# Patient Record
Sex: Female | Born: 1945 | Hispanic: No | Smoking: Never smoker
Health system: Southern US, Community
[De-identification: ages and names within clinical notes are randomized; demographics above are authoritative.]

## PROBLEM LIST (undated history)

## (undated) DIAGNOSIS — I471 Supraventricular tachycardia, unspecified: Secondary | ICD-10-CM

## (undated) DIAGNOSIS — F32A Depression, unspecified: Secondary | ICD-10-CM

## (undated) DIAGNOSIS — F329 Major depressive disorder, single episode, unspecified: Secondary | ICD-10-CM

## (undated) DIAGNOSIS — M1990 Unspecified osteoarthritis, unspecified site: Secondary | ICD-10-CM

## (undated) DIAGNOSIS — E782 Mixed hyperlipidemia: Secondary | ICD-10-CM

## (undated) DIAGNOSIS — K219 Gastro-esophageal reflux disease without esophagitis: Secondary | ICD-10-CM

## (undated) HISTORY — DX: Supraventricular tachycardia: I47.1

## (undated) HISTORY — DX: Major depressive disorder, single episode, unspecified: F32.9

## (undated) HISTORY — DX: Mixed hyperlipidemia: E78.2

## (undated) HISTORY — DX: Supraventricular tachycardia, unspecified: I47.10

## (undated) HISTORY — DX: Depression, unspecified: F32.A

## (undated) HISTORY — DX: Unspecified osteoarthritis, unspecified site: M19.90

## (undated) HISTORY — PX: ABDOMINAL HYSTERECTOMY: SUR658

## (undated) HISTORY — DX: Gastro-esophageal reflux disease without esophagitis: K21.9

## (undated) HISTORY — PX: ANKLE SURGERY: SHX546

---

## 2001-07-06 ENCOUNTER — Other Ambulatory Visit: Admission: RE | Admit: 2001-07-06 | Discharge: 2001-07-06 | Payer: Self-pay | Admitting: Unknown Physician Specialty

## 2010-11-11 DIAGNOSIS — I471 Supraventricular tachycardia: Secondary | ICD-10-CM

## 2010-11-11 DIAGNOSIS — R079 Chest pain, unspecified: Secondary | ICD-10-CM

## 2010-11-26 ENCOUNTER — Encounter: Payer: Self-pay | Admitting: *Deleted

## 2010-11-28 ENCOUNTER — Encounter: Payer: Self-pay | Admitting: Cardiovascular Disease

## 2010-11-28 ENCOUNTER — Ambulatory Visit (INDEPENDENT_AMBULATORY_CARE_PROVIDER_SITE_OTHER): Payer: PRIVATE HEALTH INSURANCE | Admitting: Cardiovascular Disease

## 2010-11-28 VITALS — BP 126/73 | HR 73 | Resp 16 | Ht 62.0 in | Wt 143.0 lb

## 2010-11-28 DIAGNOSIS — I471 Supraventricular tachycardia: Secondary | ICD-10-CM

## 2010-11-28 DIAGNOSIS — I498 Other specified cardiac arrhythmias: Secondary | ICD-10-CM

## 2010-11-28 MED ORDER — DILTIAZEM HCL ER COATED BEADS 180 MG PO CP24: 180.0000 mg | ORAL_CAPSULE | Freq: Every day | ORAL | Status: DC

## 2010-11-28 NOTE — Assessment & Plan Note (Signed)
The patient was recently diagnosed with a supraventricular tachycardia and that was her first and only episode. Her echocardiogram and stress test were overall unremarkable. She has been taking Toprol daily but it seems that the had asthma might be slightly worse than before with her increased symptoms of cough. Thus, I will stop Toprol today and start her instead on diltiazem extended release 180 mg once daily. If the patient developed a second episode of supraventricular tachycardia, I favor proceeding with an ablation procedure over starting an antiarrhythmic medication. Patient will followup in 3 months from now or earlier if needed.

## 2010-11-28 NOTE — Patient Instructions (Signed)
Your physician wants you to follow-up in: 3 months. You will receive a reminder letter in the mail one-two months in advance. If you don't receive a letter, please call our office to schedule the follow-up appointment. Stop Toprol (metoprolol) Start Diltiazem ER 180 mg daily.

## 2010-11-28 NOTE — Progress Notes (Signed)
HPI  This is a 65 year old female who is here today for a followup visit. The patient was in her usual health up until recently last month when she woke up in the morning and had significant palpitations associated with dizziness and presyncope. EMS was called. Upon arrival, she was found to be in a narrow complex tachycardia with a heart rate of 210 beats per minute with significant hypotension and systolic blood pressure in the 70s. The patient was cardioverted emergently to normal sinus rhythm by EMS. She had chest pain that persisted for a few hours after the event. She was hospitalized at [facility]. Cardiac enzymes were slightly elevated. She had no recurrent arrhythmia. She had an echocardiogram done which was unremarkable. She also had a pharmacologic nuclear stress test which also was within normal limits. The patient other medical history includes asthma. She had no previous arrhythmia at all. This was her first episode of supraventricular tachycardia. Her thyroid function was normal. She does not consume excessive caffeine. The event was not triggered by anxiety or any other emotional event. The patient was discharged home on Toprol 50 mg once daily. Since then, the patient has not had any recurrent palpitations. She is having more cough than usual. Her asthma has been under very well controlled for many years.  Allergies  Allergen Reactions  . Sulfa Antibiotics      Current Outpatient Prescriptions on File Prior to Visit  Medication Sig Dispense Refill  . aspirin 81 MG tablet Take 81 mg by mouth daily.        . DULoxetine (CYMBALTA) 60 MG capsule Take 60 mg by mouth daily.        Marland Kitchen estradiol (ESTRACE) 1 MG tablet Take 1 mg by mouth daily.       . montelukast (SINGULAIR) 10 MG tablet Take 10 mg by mouth at bedtime.        . pantoprazole (PROTONIX) 40 MG tablet Take 40 mg by mouth daily.        . sertraline (ZOLOFT) 100 MG tablet Take 100 mg by mouth daily.           Past Medical  History  Diagnosis Date  . Asthma   . GERD (gastroesophageal reflux disease)   . Osteoarthritis   . Depression   . PSVT (paroxysmal supraventricular tachycardia)      Past Surgical History  Procedure Date  . Abdominal hysterectomy   . Left ankle surgery     ORIF     Family History  Problem Relation Age of Onset  . Other      negative for premature coronary artery disease or arrhythmia  . Ovarian cancer Mother      History   Social History  . Marital Status: Married    Spouse Name: N/A    Number of Children: N/A  . Years of Education: N/A   Occupational History  .      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 Smoking status: Never Smoker   . Smokeless tobacco: Never Used  . Alcohol Use: No  . Drug Use: No  . Sexually Active: Not on file   Other Topics Concern  . Not on file   Social History Narrative  . No narrative on file       PHYSICAL EXAM   BP 126/73  Pulse 73  Resp 16  Ht 5\' 2"  (1.575 m)  Wt 143 lb (64.864 kg)  BMI 26.15 kg/m2  Constitutional: She is oriented to person, place, and time. She appears well-developed and well-nourished. No distress.  HENT: No nasal discharge.  Head: Normocephalic and atraumatic.  Eyes: Pupils are equal, round, and reactive to light. Right eye exhibits no discharge. Left eye exhibits no discharge.  Neck: Normal range of motion. Neck supple. No JVD present. No thyromegaly present.  Cardiovascular: Normal rate, regular rhythm, normal heart sounds and intact distal pulses. Exam reveals no gallop and no friction rub.  No murmur heard.  Pulmonary/Chest: Effort normal and breath sounds normal. No stridor. No respiratory distress. She has no wheezes. She has no rales. She exhibits no tenderness.  Abdominal: Soft. Bowel sounds are normal. She exhibits no distension. There is no tenderness. There is no rebound and no guarding.  Musculoskeletal: Normal range of motion. She exhibits no  edema and no tenderness.  Neurological: She is alert and oriented to person, place, and time. Coordination normal.  Skin: Skin is warm and dry. No rash noted. She is not diaphoretic. No erythema. No pallor.  Psychiatric: She has a normal mood and affect. Her behavior is normal. Judgment and thought content normal.    EKG: Normal sinus rhythm with no significant ST or T wave changes. PR and QT intervals are normal. There is poor R wave progression in the anterior leads.   ASSESSMENT AND PLAN

## 2011-05-08 ENCOUNTER — Ambulatory Visit (INDEPENDENT_AMBULATORY_CARE_PROVIDER_SITE_OTHER): Payer: PRIVATE HEALTH INSURANCE | Admitting: Cardiovascular Disease

## 2011-05-08 ENCOUNTER — Encounter: Payer: Self-pay | Admitting: Cardiovascular Disease

## 2011-05-08 VITALS — BP 137/85 | HR 87 | Ht 62.0 in | Wt 146.0 lb

## 2011-05-08 DIAGNOSIS — I471 Supraventricular tachycardia, unspecified: Secondary | ICD-10-CM

## 2011-05-08 MED ORDER — DILTIAZEM HCL ER COATED BEADS 240 MG PO CP24: 240.0000 mg | ORAL_CAPSULE | Freq: Every day | ORAL | Status: DC

## 2011-05-08 NOTE — Assessment & Plan Note (Signed)
Patient seems to be doing reasonably well. She had no prolonged episodes of tachycardia. She had few episodes that lasted for less than a minute. I recommend increasing the dose of diltiazem extended release 240 mg once daily. If she develops any prolonged episodes in the future, I recommend proceeding with ablation. She is to followup in 6 months or earlier if needed.

## 2011-05-08 NOTE — Patient Instructions (Signed)
Your physician recommends that you schedule a follow-up appointment in:6 months. You will receive a reminder letter in the mail about 1-2 months in advance reminding you to call and schedule your appointment. If you don't receive this letter, please contact our office.  Your physician has recommended you make the following change in your medication: INCREASED DILTIAZEM ER TO 240 MG DAILY. Your new prescription has been sent to your pharmacy.

## 2011-05-08 NOTE — Progress Notes (Signed)
HPI  This is a 66 year old female who is here today for followup visit. She has a history of paroxysmal supraventricular tachycardia. She had only one episode but that was actually severe associated with a heart rate of 210 beats per minute with hypotension. Required emergent cardioversion by EMS. Echocardiogram and nuclear stress test were both unremarkable. She was initially treated with metoprolol which was switched during her last visit to diltiazem due to worsening asthma. Since that time, she has not had any prolonged episodes. She had few episodes that lasted less than a minute and she was able to terminate them with her vagal maneuvers. Otherwise he denies any chest pain or dyspnea.  Allergies  Allergen Reactions  . Sulfa Antibiotics      Current Outpatient Prescriptions on File Prior to Visit  Medication Sig Dispense Refill  . ALPHA LIPOIC ACID PO Take by mouth daily.        Marland Kitchen aspirin 81 MG tablet Take 81 mg by mouth daily.        Jeanie Cooks SERRATA PO Take by mouth.        . Bromelains (BROMELAIN PO) Take by mouth.        . Calcium Carbonate-Vitamin D (CALCIUM 600 + D PO) Take by mouth daily.        . DULoxetine (CYMBALTA) 60 MG capsule Take 60 mg by mouth daily.        Marland Kitchen estradiol (ESTRACE) 1 MG tablet Take 1 mg by mouth daily.       . Lactobacillus (ACIDOPHILUS PO) Take by mouth.        Marland Kitchen MAGNESIUM PO Take by mouth.        . montelukast (SINGULAIR) 10 MG tablet Take 10 mg by mouth at bedtime.        . Multiple Vitamin (MULTIVITAMIN) capsule Take 1 capsule by mouth daily.        . NON FORMULARY ajipure Daily        . Nutritional Supplements (ARTHRO-COMPLEX PO) Take by mouth daily.        . Nutritional Supplements (JOINT FORMULA PO) Take by mouth daily.        . Omega-3 Fatty Acids (FISH OIL PO) Take by mouth.        . pantoprazole (PROTONIX) 40 MG tablet Take 40 mg by mouth daily.        Marland Kitchen QUERCETIN PO Take by mouth.        . sertraline (ZOLOFT) 100 MG tablet Take 100 mg by  mouth daily.        . Vitamins C E (VITAMIN C & E COMPLEX PO) Take by mouth.           Past Medical History  Diagnosis Date  . Asthma   . GERD (gastroesophageal reflux disease)   . Osteoarthritis   . Depression   . PSVT (paroxysmal supraventricular tachycardia)      Past Surgical History  Procedure Date  . Abdominal hysterectomy   . Left ankle surgery     ORIF     Family History  Problem Relation Age of Onset  . Other      negative for premature coronary artery disease or arrhythmia  . Ovarian cancer Mother      History   Social History  . Marital Status: Married    Spouse Name: N/A    Number of Children: N/A  . Years of Education: N/A   Occupational History  .      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 Smoking status: Never Smoker   . Smokeless tobacco: Never Used  . Alcohol Use: No  . Drug Use: No  . Sexually Active: Not on file   Other Topics Concern  . Not on file   Social History Narrative  . No narrative on file     PHYSICAL EXAM   BP 137/85  Pulse 87  Ht 5\' 2"  (1.575 m)  Wt 146 lb (66.225 kg)  BMI 26.70 kg/m2 Constitutional: She is oriented to person, place, and time. She appears well-developed and well-nourished. No distress.  HENT: No nasal discharge.  Head: Normocephalic and atraumatic.  Eyes: Pupils are equal and round. Right eye exhibits no discharge. Left eye exhibits no discharge.  Neck: Normal range of motion. Neck supple. No JVD present. No thyromegaly present.  Cardiovascular: Normal rate, regular rhythm, normal heart sounds. Exam reveals no gallop and no friction rub. No murmur heard.  Pulmonary/Chest: Effort normal and breath sounds normal. No stridor. No respiratory distress. She has no wheezes. She has no rales. She exhibits no tenderness.  Abdominal: Soft. Bowel sounds are normal. She exhibits no distension. There is no tenderness. There is no rebound and no guarding.  Musculoskeletal:  Normal range of motion. She exhibits no edema and no tenderness.  Neurological: She is alert and oriented to person, place, and time. Coordination normal.  Skin: Skin is warm and dry. No rash noted. She is not diaphoretic. No erythema. No pallor.  Psychiatric: She has a normal mood and affect. Her behavior is normal. Judgment and thought content normal.      ASSESSMENT AND PLAN

## 2011-11-13 ENCOUNTER — Ambulatory Visit: Payer: PRIVATE HEALTH INSURANCE | Admitting: Cardiology

## 2011-12-10 ENCOUNTER — Other Ambulatory Visit: Payer: Self-pay | Admitting: Cardiovascular Disease

## 2011-12-30 ENCOUNTER — Ambulatory Visit: Payer: PRIVATE HEALTH INSURANCE | Admitting: Cardiology

## 2011-12-30 ENCOUNTER — Encounter: Payer: Self-pay | Admitting: Cardiology

## 2011-12-30 DIAGNOSIS — I471 Supraventricular tachycardia: Secondary | ICD-10-CM | POA: Insufficient documentation

## 2011-12-30 DIAGNOSIS — R9430 Abnormal result of cardiovascular function study, unspecified: Secondary | ICD-10-CM | POA: Insufficient documentation

## 2011-12-30 DIAGNOSIS — M1990 Unspecified osteoarthritis, unspecified site: Secondary | ICD-10-CM | POA: Insufficient documentation

## 2011-12-30 DIAGNOSIS — F329 Major depressive disorder, single episode, unspecified: Secondary | ICD-10-CM | POA: Insufficient documentation

## 2011-12-30 DIAGNOSIS — K219 Gastro-esophageal reflux disease without esophagitis: Secondary | ICD-10-CM | POA: Insufficient documentation

## 2011-12-30 DIAGNOSIS — IMO0001 Reserved for inherently not codable concepts without codable children: Secondary | ICD-10-CM | POA: Insufficient documentation

## 2011-12-30 DIAGNOSIS — J45909 Unspecified asthma, uncomplicated: Secondary | ICD-10-CM | POA: Insufficient documentation

## 2012-01-01 ENCOUNTER — Ambulatory Visit (INDEPENDENT_AMBULATORY_CARE_PROVIDER_SITE_OTHER): Payer: PRIVATE HEALTH INSURANCE | Admitting: Cardiology

## 2012-01-01 ENCOUNTER — Encounter: Payer: Self-pay | Admitting: Cardiology

## 2012-01-01 VITALS — BP 129/78 | HR 85 | Ht 62.0 in | Wt 147.0 lb

## 2012-01-01 DIAGNOSIS — K219 Gastro-esophageal reflux disease without esophagitis: Secondary | ICD-10-CM

## 2012-01-01 DIAGNOSIS — I471 Supraventricular tachycardia, unspecified: Secondary | ICD-10-CM

## 2012-01-01 DIAGNOSIS — J45909 Unspecified asthma, uncomplicated: Secondary | ICD-10-CM

## 2012-01-01 DIAGNOSIS — F329 Major depressive disorder, single episode, unspecified: Secondary | ICD-10-CM

## 2012-01-01 DIAGNOSIS — F3289 Other specified depressive episodes: Secondary | ICD-10-CM

## 2012-01-01 NOTE — Assessment & Plan Note (Signed)
The patient is doing well. She is on medication. She does not appear to be suffering from any significant depression today.

## 2012-01-01 NOTE — Progress Notes (Signed)
Patient ID: Sheena Clay, female   DOB: 03-15-1946, 66 y.o.   MRN: [id number]   HPI   The patient is seen today to followup supraventricular tachycardia. She saw Dr. Kirke Corin in our office in February, 2013. He is based primarily in our [place] office. I explained this to her and I will provide her care over time. She understands. The patient had an episode of supraventricular tachycardia with a rate of 210. She had hypotension. She did require emergent cardioversion by EMS. Workup included a normal echo and a normal stress test. She is on diltiazem. She is not on a beta blocker because of worsening asthma. She has been stable. We have raised the question of proceeding with an ablation. She does not want to have this unless she has further problems. She has some very short palpitations at times. She is stable.  As part of today's evaluation I have reviewed the prior records in the chart and adjusted the problem list.  Allergies  Allergen Reactions  . Sulfa Antibiotics     Current Outpatient Prescriptions  Medication Sig Dispense Refill  . ALPHA LIPOIC ACID PO Take by mouth daily.        Marland Kitchen aspirin 81 MG tablet Take 81 mg by mouth daily.        Jeanie Cooks SERRATA PO Take by mouth.        . Bromelains (BROMELAIN PO) Take by mouth.        . Calcium Carbonate-Vitamin D (CALCIUM 600 + D PO) Take by mouth daily.        Marland Kitchen CARDIZEM CD 240 MG 24 hr capsule TAKE 1 CAPSULE BY MOUTH ONCE DAILY.  90 capsule  3  . DULoxetine (CYMBALTA) 60 MG capsule Take 60 mg by mouth daily.        Marland Kitchen estradiol (ESTRACE) 1 MG tablet Take 1 mg by mouth daily.       . Lactobacillus (ACIDOPHILUS PO) Take by mouth.        Marland Kitchen MAGNESIUM PO Take by mouth.        . montelukast (SINGULAIR) 10 MG tablet Take 10 mg by mouth at bedtime.        . Multiple Vitamin (MULTIVITAMIN) capsule Take 1 capsule by mouth daily.        . NON FORMULARY ajipure Daily        . Nutritional Supplements (ARTHRO-COMPLEX PO) Take by mouth daily.          . Nutritional Supplements (JOINT FORMULA PO) Take by mouth daily.        . Omega-3 Fatty Acids (FISH OIL PO) Take by mouth.        . pantoprazole (PROTONIX) 40 MG tablet Take 40 mg by mouth daily.        Marland Kitchen QUERCETIN PO Take by mouth.        . sertraline (ZOLOFT) 100 MG tablet Take 100 mg by mouth daily.        . Vitamins C E (VITAMIN C & E COMPLEX PO) Take by mouth.          History   Social History  . Marital Status: Married    Spouse Name: N/A    Number of Children: N/A  . Years of Education: N/A   Occupational History  .      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 Smoking status: Never Smoker   . Smokeless tobacco: Never Used  . Alcohol Use:  No  . Drug Use: No  . Sexually Active: Not on file   Other Topics Concern  . Not on file   Social History Narrative  . No narrative on file    Family History  Problem Relation Age of Onset  . Other      negative for premature coronary artery disease or arrhythmia  . Ovarian cancer Mother     Past Medical History  Diagnosis Date  . Asthma   . GERD (gastroesophageal reflux disease)   . Osteoarthritis   . Depression   . PSVT (paroxysmal supraventricular tachycardia)     210 beats per minute with hypotension, required emergent cardioversion by EMS,  ablation has been recommended by the cardiology team  . Ejection fraction     EF 65%, echo, August, 2012  . Normal cardiac stress test     Normal nuclear stress test, August, 2012    Past Surgical History  Procedure Date  . Abdominal hysterectomy   . Left ankle surgery     ORIF    Patient Active Problem List  Diagnosis  . GERD (gastroesophageal reflux disease)  . Osteoarthritis  . Depression  . PSVT (paroxysmal supraventricular tachycardia)  . Asthma  . Ejection fraction  . Normal cardiac stress test    ROS   Patient denies fever, chills, headache, sweats, rash, change in vision, change in hearing, chest pain, cough, nausea  vomiting, urinary symptoms. All other systems are reviewed and are negative.  PHYSICAL EXAM  Patient is oriented to person time and place. Affect is normal. There is no jugular venous distention. Lungs are clear. Respiratory effort is nonlabored. Cardiac exam reveals S1 and S2. There no clicks or significant murmurs. The abdomen is soft. There is no peripheral edema. There no musculoskeletal deformities. There are no skin rashes.  Filed Vitals:   01/01/12 1324  BP: 129/78  Pulse: 85  Height: 5\' 2"  (1.575 m)  Weight: 147 lb (66.679 kg)  SpO2: 98%   EKG is done today and reviewed by me. The rhythm is sinus. There are no diagnostic changes. There is no change from the past.  ASSESSMENT & PLAN

## 2012-01-01 NOTE — Assessment & Plan Note (Signed)
She is not having any significant GERD symptoms at this time. No further workup.

## 2012-01-01 NOTE — Patient Instructions (Addendum)

## 2012-01-01 NOTE — Assessment & Plan Note (Signed)
The patient is treated for asthma. She has been stable. When beta blockers were used it caused some increased symptoms. She is stable now.

## 2012-01-01 NOTE — Assessment & Plan Note (Signed)
The patient had rapid supraventricular tachycardia. She is stable. She continues on diltiazem. I made it clear to her that she should let us know of she has any increased symptoms. Her rate was very fast with her supraventricular tachycardia. If she has recurrent problems consideration should be given to proceeding with ablation. She has been hesitant up to this point. I will see her back in one year for followup.

## 2012-12-15 ENCOUNTER — Other Ambulatory Visit: Payer: Self-pay | Admitting: Cardiovascular Disease

## 2012-12-15 ENCOUNTER — Other Ambulatory Visit: Payer: Self-pay | Admitting: *Deleted

## 2012-12-15 MED ORDER — DILTIAZEM HCL ER COATED BEADS 240 MG PO CP24: ORAL_CAPSULE | ORAL | Status: DC

## 2012-12-15 NOTE — Telephone Encounter (Signed)
Refilled Diltiazem sent to [facility]. Pharmacy.

## 2013-01-09 ENCOUNTER — Ambulatory Visit (INDEPENDENT_AMBULATORY_CARE_PROVIDER_SITE_OTHER): Payer: PRIVATE HEALTH INSURANCE | Admitting: Cardiology

## 2013-01-09 ENCOUNTER — Encounter: Payer: Self-pay | Admitting: Cardiology

## 2013-01-09 VITALS — BP 123/75 | HR 75 | Ht 62.0 in | Wt 139.0 lb

## 2013-01-09 DIAGNOSIS — I471 Supraventricular tachycardia: Secondary | ICD-10-CM

## 2013-01-09 DIAGNOSIS — K219 Gastro-esophageal reflux disease without esophagitis: Secondary | ICD-10-CM

## 2013-01-09 DIAGNOSIS — E785 Hyperlipidemia, unspecified: Secondary | ICD-10-CM

## 2013-01-09 DIAGNOSIS — Z888 Allergy status to other drugs, medicaments and biological substances status: Secondary | ICD-10-CM

## 2013-01-09 DIAGNOSIS — Z789 Other specified health status: Secondary | ICD-10-CM

## 2013-01-09 NOTE — Progress Notes (Signed)
HPI  This very pleasant lady is here for the followup of her supraventricular tachycardia. She is doing well. She has not had return of any of her significant arrhythmias. She has rapid SVT. She knows that if she has recurrence that ablation is a option. She does feel rare palpitations when she uses her medicines for migraines. She has an elevated LDL with a high HDL. She is statin intolerant.  Allergies  Allergen Reactions  . Sulfa Antibiotics   . Penicillins Itching and Rash    Current Outpatient Prescriptions  Medication Sig Dispense Refill  . ALPHA LIPOIC ACID PO Take by mouth daily.        Marland Kitchen aspirin 81 MG tablet Take 81 mg by mouth daily.        Jeanie Cooks SERRATA PO Take by mouth.        . Bromelains (BROMELAIN PO) Take by mouth.        . Calcium Carbonate-Vitamin D (CALCIUM 600 + D PO) Take by mouth daily.        Marland Kitchen diltiazem (CARDIZEM CD) 240 MG 24 hr capsule TAKE 1 CAPSULE BY MOUTH ONCE DAILY.  90 capsule  3  . DULoxetine (CYMBALTA) 60 MG capsule Take 60 mg by mouth daily.        Marland Kitchen esomeprazole (NEXIUM) 40 MG capsule Take 40 mg by mouth daily before breakfast.      . estradiol (ESTRACE) 1 MG tablet Take 0.5 mg by mouth daily.       . Lactobacillus (ACIDOPHILUS PO) Take by mouth.        Marland Kitchen MAGNESIUM PO Take by mouth.        . montelukast (SINGULAIR) 10 MG tablet Take 10 mg by mouth at bedtime.        . Multiple Vitamin (MULTIVITAMIN) capsule Take 1 capsule by mouth daily.        . NON FORMULARY ajipure Daily        . Nutritional Supplements (ARTHRO-COMPLEX PO) Take by mouth daily.        . Nutritional Supplements (JOINT FORMULA PO) Take by mouth daily.        . Omega-3 Fatty Acids (FISH OIL PO) Take by mouth.        . QUERCETIN PO Take by mouth.        . sertraline (ZOLOFT) 100 MG tablet Take 100 mg by mouth daily.        . Vitamins C E (VITAMIN C & E COMPLEX PO) Take by mouth.         No current facility-administered medications for this visit.    History   Social  History  . Marital Status: Married    Spouse Name: N/A    Number of Children: N/A  . Years of Education: N/A   Occupational History  .      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 Smoking status: Never Smoker   . Smokeless tobacco: Never Used  . Alcohol Use: No  . Drug Use: No  . Sexual Activity: Not on file   Other Topics Concern  . Not on file   Social History Narrative  . No narrative on file    Family History  Problem Relation Age of Onset  . Other      negative for premature coronary artery disease or arrhythmia  . Ovarian cancer Mother     Past Medical History  Diagnosis Date  . Asthma   .  GERD (gastroesophageal reflux disease)   . Osteoarthritis   . Depression   . PSVT (paroxysmal supraventricular tachycardia)     210 beats per minute with hypotension, required emergent cardioversion by EMS,  ablation has been recommended by the cardiology team  . Ejection fraction     EF 65%, echo, August, 2012  . Normal cardiac stress test     Normal nuclear stress test, August, 2012    Past Surgical History  Procedure Laterality Date  . Abdominal hysterectomy    . Left ankle surgery      ORIF    Patient Active Problem List   Diagnosis Date Noted  . Dyslipidemia 01/09/2013  . Statin intolerance 01/09/2013  . Asthma 12/30/2011  . GERD (gastroesophageal reflux disease)   . Osteoarthritis   . Depression   . PSVT (paroxysmal supraventricular tachycardia)   . Ejection fraction   . Normal cardiac stress test     ROS   Patient denies fever, chills, headache, sweats, rash, change in vision, change in hearing, chest pain, cough, nausea vomiting, urinary symptoms. All other systems are reviewed and are negative.  PHYSICAL EXAM   Patient is oriented to person time and place. Affect is normal. There is no jugular venous distention. Lungs are clear. Respiratory effort is nonlabored. Cardiac exam reveals S1 and S2. There no clicks or  significant murmurs. The abdomen is soft. There is no peripheral edema.  Filed Vitals:   01/09/13 0809  BP: 123/75  Pulse: 75  Height: 5\' 2"  (1.575 m)  Weight: 139 lb (63.05 kg)     ASSESSMENT & PLAN

## 2013-01-09 NOTE — Patient Instructions (Signed)

## 2013-01-09 NOTE — Assessment & Plan Note (Signed)
Unfortunately she is statin intolerant. She does not have proven coronary disease. She has a high HDL. No change in therapy.

## 2013-01-09 NOTE — Assessment & Plan Note (Signed)
She has not had any return of her rapid supraventricular tachycardia. She knows to contact us if she does.

## 2013-01-09 NOTE — Assessment & Plan Note (Signed)
I suggested that she put some blocks under the head of her bed.

## 2013-12-14 ENCOUNTER — Other Ambulatory Visit: Payer: Self-pay | Admitting: Cardiovascular Disease

## 2014-01-01 ENCOUNTER — Ambulatory Visit (INDEPENDENT_AMBULATORY_CARE_PROVIDER_SITE_OTHER): Payer: 59 | Admitting: Cardiology

## 2014-01-01 ENCOUNTER — Encounter: Payer: Self-pay | Admitting: Cardiology

## 2014-01-01 VITALS — BP 133/84 | HR 69 | Ht 62.0 in | Wt 142.5 lb

## 2014-01-01 DIAGNOSIS — R9430 Abnormal result of cardiovascular function study, unspecified: Secondary | ICD-10-CM

## 2014-01-01 DIAGNOSIS — E785 Hyperlipidemia, unspecified: Secondary | ICD-10-CM

## 2014-01-01 DIAGNOSIS — I471 Supraventricular tachycardia: Secondary | ICD-10-CM

## 2014-01-01 DIAGNOSIS — R0989 Other specified symptoms and signs involving the circulatory and respiratory systems: Secondary | ICD-10-CM

## 2014-01-01 NOTE — Progress Notes (Signed)
Patient ID: Sheena Clay, female   DOB: Mar 09, 1946, 68 y.o.   MRN: [id number]    HPI  Patient is seen today to followup a history of supraventricular tachycardia. In 2010 she had an episode of SVT with a rate of 210. She was evaluated fully. She had normal LV function and no signs of ischemia. It was felt that an ablation could definitely be undertaken. The patient decided that she wanted to be followed over time. If she has recurrent problems more aggressive EP evaluation will be in order. She has not had any symptoms.  Allergies  Allergen Reactions  . Sulfa Antibiotics   . Penicillins Itching and Rash    Current Outpatient Prescriptions  Medication Sig Dispense Refill  . aspirin 81 MG tablet Take 81 mg by mouth daily.        . Calcium Carbonate-Vitamin D (CALCIUM 600 + D PO) Take by mouth daily.        Marland Kitchen. CARTIA XT 240 MG 24 hr capsule TAKE 1 CAPSULE BY MOUTH ONCE A DAY.  30 capsule  1  . Cetirizine HCl (ZYRTEC PO) Take by mouth daily.      . DULoxetine (CYMBALTA) 60 MG capsule Take 60 mg by mouth daily.        Marland Kitchen. esomeprazole (NEXIUM) 40 MG capsule Take 40 mg by mouth daily before breakfast.      . fluticasone (FLONASE) 50 MCG/ACT nasal spray Place 1 spray into both nostrils daily.      . montelukast (SINGULAIR) 10 MG tablet Take 10 mg by mouth at bedtime.        . Multiple Vitamin (MULTIVITAMIN) capsule Take 1 capsule by mouth daily.        . Nutritional Supplements (ARTHRO-COMPLEX PO) Take by mouth daily.        . Nutritional Supplements (JOINT FORMULA PO) Take by mouth daily.        . Omega-3 Fatty Acids (FISH OIL PO) Take by mouth.        . sertraline (ZOLOFT) 100 MG tablet Take 100 mg by mouth daily.         No current facility-administered medications for this visit.    History   Social History  . Marital Status: Married    Spouse Name: N/A    Number of Children: N/A  . Years of Education: N/A   Occupational History  .      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 Smoking status: Never Smoker   . Smokeless tobacco: Never Used  . Alcohol Use: No  . Drug Use: No  . Sexual Activity: Not on file   Other Topics Concern  . Not on file   Social History Narrative  . No narrative on file    Family History  Problem Relation Age of Onset  . Other      negative for premature coronary artery disease or arrhythmia  . Ovarian cancer Mother     Past Medical History  Diagnosis Date  . Asthma   . GERD (gastroesophageal reflux disease)   . Osteoarthritis   . Depression   . PSVT (paroxysmal supraventricular tachycardia)     210 beats per minute with hypotension, required emergent cardioversion by EMS,  ablation has been recommended by the cardiology team  . Ejection fraction     EF 65%, echo, August, 2012  . Normal cardiac stress test     Normal nuclear stress test, August, 2012  Past Surgical History  Procedure Laterality Date  . Abdominal hysterectomy    . Left ankle surgery      ORIF    Patient Active Problem List   Diagnosis Date Noted  . Dyslipidemia 01/09/2013  . Statin intolerance 01/09/2013  . Asthma 12/30/2011  . GERD (gastroesophageal reflux disease)   . Osteoarthritis   . Depression   . PSVT (paroxysmal supraventricular tachycardia)   . Ejection fraction   . Normal cardiac stress test     ROS   Patient denies fever, chills, headache, sweats, rash, change in vision, change in hearing, chest pain, cough, nausea vomiting, urinary symptoms. All other systems are reviewed and are negative.  PHYSICAL EXAM  Patient is oriented to person time and place. Affect is normal. Head is atraumatic. Sclera and conjunctiva are normal. There is no jugulovenous distention. Lungs are clear. Respiratory effort is nonlabored. Cardiac exam reveals S1 and S2. The abdomen is soft. There is no peripheral edema.  Filed Vitals:   01/01/14 1410  BP: 133/84  Pulse: 69  Height: 5\' 2"  (1.575 m)  Weight: 142 lb 8 oz  (64.638 kg)  SpO2: 100%   EKG is done today and reviewed by me. There is no significant change from the past.  ASSESSMENT & PLAN

## 2014-01-01 NOTE — Assessment & Plan Note (Signed)
The patient had an episode of very significant SVT in 2012. Heart rate was 210. There was hypotension. She required emergent urgent by EMS. She's been completely stable since then. She does not need any further evaluation at this time. If she has recurrent SVT of this type, ablation will be strongly considered.

## 2014-01-01 NOTE — Assessment & Plan Note (Signed)
The patient is statin intolerant.

## 2014-01-01 NOTE — Patient Instructions (Signed)
Continue all current medications. Your physician wants you to follow up in:  1 year.  You will receive a reminder letter in the mail one-two months in advance.  If you don't receive a letter, please call our office to schedule the follow up appointment   

## 2014-01-01 NOTE — Assessment & Plan Note (Signed)
Her ejection fraction was normal by echo in August, 2012. No further workup.

## 2014-01-12 ENCOUNTER — Other Ambulatory Visit: Payer: Self-pay | Admitting: Cardiology

## 2014-09-12 ENCOUNTER — Other Ambulatory Visit: Payer: Self-pay | Admitting: Cardiology

## 2014-12-12 ENCOUNTER — Other Ambulatory Visit: Payer: Self-pay | Admitting: Cardiology

## 2015-01-01 ENCOUNTER — Encounter: Payer: Self-pay | Admitting: Cardiology

## 2015-01-01 ENCOUNTER — Ambulatory Visit (INDEPENDENT_AMBULATORY_CARE_PROVIDER_SITE_OTHER): Payer: Medicare PPO | Admitting: Cardiology

## 2015-01-01 VITALS — BP 114/73 | HR 87 | Ht 62.0 in | Wt 145.0 lb

## 2015-01-01 DIAGNOSIS — Z23 Encounter for immunization: Secondary | ICD-10-CM

## 2015-01-01 DIAGNOSIS — I471 Supraventricular tachycardia: Secondary | ICD-10-CM | POA: Diagnosis not present

## 2015-01-01 NOTE — Progress Notes (Signed)
Cardiology Office Note  Date: 01/01/2015   ID: Georgiann HahnKathy J [facility], DOB 06/06/1945, MRN [id number]  PCP: Louie BostonAPPER,DAVID B, MD  Primary Cardiologist: Nona DellSamuel Raylyn Carton, MD   Chief Complaint  Patient presents with  . PSVT    History of Present Illness: Sheena Clay is a 69 y.o. female former patient of Dr. Myrtis SerKatz, now establishing with me for regular follow-up. This is our first meeting.  I reviewed her records. She has a history of very rapid, symptomatic PSVT associated with syncope and requiring urgent cardioversion back in 2012. Structural and ischemic cardiac workup was reassuring at that time as outlined below, and she has been managed medically since then.  She tells me that within the last year she has had 2 episodes of brief palpitations lasting no more than a few minutes. She bears down and does vagal maneuvers when this occurs, has not had chest pain or syncope with any of these brief events. She is satisfied with her symptom control now, and continues on Cardizem CD.  ECG today shows normal sinus rhythm with borderline low voltage and decreased R wave progression, R prime in lead V1 and V2.   Past Medical History  Diagnosis Date  . Asthma   . GERD (gastroesophageal reflux disease)   . Osteoarthritis   . Depression   . PSVT (paroxysmal supraventricular tachycardia) (HCC)     Documented 2012 with syncope and urgent cardioversion    Current Outpatient Prescriptions  Medication Sig Dispense Refill  . aspirin 81 MG tablet Take 81 mg by mouth daily.      . Calcium Carbonate-Vitamin D (CALCIUM 600 + D PO) Take by mouth daily.      . Cetirizine HCl (ZYRTEC PO) Take by mouth daily.    Marland Kitchen. diltiazem (CARDIZEM CD) 240 MG 24 hr capsule TAKE 1 CAPSULE DAILY. 30 capsule 6  . DULoxetine (CYMBALTA) 60 MG capsule Take 60 mg by mouth daily.      Marland Kitchen. esomeprazole (NEXIUM) 40 MG capsule Take 40 mg by mouth daily before breakfast.    . fluticasone (FLONASE) 50 MCG/ACT nasal spray Place 1 spray  into both nostrils daily.    . montelukast (SINGULAIR) 10 MG tablet Take 10 mg by mouth at bedtime.      . Multiple Vitamin (MULTIVITAMIN) capsule Take 1 capsule by mouth daily.      . Nutritional Supplements (ARTHRO-COMPLEX PO) Take by mouth daily.      . Nutritional Supplements (JOINT FORMULA PO) Take by mouth daily.      . Omega-3 Fatty Acids (FISH OIL PO) Take by mouth.      . sertraline (ZOLOFT) 100 MG tablet Take 100 mg by mouth daily.       No current facility-administered medications for this visit.    Allergies:  Sulfa antibiotics and Penicillins   Social History: The patient  reports that she has never smoked. She has never used smokeless tobacco. She reports that she does not drink alcohol or use illicit drugs.   ROS:  Please see the history of present illness. Otherwise, complete review of systems is positive for none.  All other systems are reviewed and negative.   Physical Exam: VS:  BP 114/73 mmHg  Pulse 87  Ht 5\' 2"  (1.575 m)  Wt 145 lb (65.772 kg)  BMI 26.51 kg/m2  SpO2 96%, BMI Body mass index is 26.51 kg/(m^2).  Wt Readings from Last 3 Encounters:  01/01/15 145 lb (65.772 kg)  01/01/14 142 lb 8 oz (  64.638 kg)  01/09/13 139 lb (63.05 kg)     General: Patient appears comfortable at rest. HEENT: Conjunctiva and lids normal, oropharynx clear. Neck: Supple, no elevated JVP or carotid bruits, no thyromegaly. Lungs: Clear to auscultation, nonlabored breathing at rest. Cardiac: Regular rate and rhythm, no S3 or significant systolic murmur, no pericardial rub. Abdomen: Soft, nontender, bowel sounds present. Extremities: No pitting edema, distal pulses 2+.  ECG: ECG is ordered today.  Other Studies Reviewed Today:  Echocardiogram 11/11/2010 [facility]) reported LVEF greater than 65% with diastolic dysfunction, trace mitral regurgitation, trace tricuspid regurgitation, RVSP 18 mmHg.  Lexiscan Cardiolite 11/11/2010 [facility]) reported no diagnostic ST segment  abnormalities or arrhythmias, breast attenuation artifact without clear evidence of scar or ischemia, LVEF 83%.  Assessment and Plan:  History of PSVT as outlined, symptomatically well controlled at this time on Cardizem CD. Follow-up ECG reviewed. We discussed the possibility of considering radiofrequency ablation if symptoms escalate despite medical therapy, for now we will continue observation.  Current medicines were reviewed with the patient today.   Orders Placed This Encounter  Procedures  . EKG 12-Lead    Disposition: FU with me in 1 year.   Signed, Jonelle Sidle, MD, [facility] 01/01/2015 1:23 PM    [facility] at [facility] [address] [place], [place], [place] [address] Phone: [phone]; Fax: [phone]

## 2015-01-01 NOTE — Patient Instructions (Signed)

## 2015-08-09 ENCOUNTER — Other Ambulatory Visit: Payer: Self-pay | Admitting: Cardiology

## 2015-12-20 ENCOUNTER — Encounter: Payer: Self-pay | Admitting: *Deleted

## 2015-12-22 NOTE — Progress Notes (Signed)
Cardiology Office Note  Date: 12/23/2015   ID: Sheena [facility]Kathy J [facility], DOB Apr 02, 1945, MRN [id number]  PCP: Louie BostonAPPER,DAVID B, MD  Primary Cardiologist: Nona DellSamuel Ty Oshima, MD   Chief Complaint  Patient presents with  . PSVT    History of Present Illness: Sheena Clay is a 70 y.o. female last seen in October 2016. She presents for a routine follow-up visit. Over the last year she reports no significant palpitations. She continues on Cardizem CD 240 mg daily and aspirin. She remains busy, no decline in stamina, no exertional chest pain or syncope.  I reviewed her ECG today which shows sinus rhythm with left anterior fascicular block.  Past Medical History:  Diagnosis Date  . Asthma   . Depression   . GERD (gastroesophageal reflux disease)   . Osteoarthritis   . PSVT (paroxysmal supraventricular tachycardia) (HCC)    Documented 2012 with syncope and urgent cardioversion    Current Outpatient Prescriptions  Medication Sig Dispense Refill  . aspirin 81 MG tablet Take 81 mg by mouth daily.      . Calcium Carbonate-Vitamin D (CALCIUM 600 + D PO) Take by mouth daily.      . Cetirizine HCl (ZYRTEC PO) Take 10 mg by mouth daily.     Marland Kitchen. diltiazem (CARDIZEM CD) 240 MG 24 hr capsule TAKE 1 CAPSULE DAILY. 30 capsule 11  . DULoxetine (CYMBALTA) 60 MG capsule Take 60 mg by mouth daily.      Marland Kitchen. estradiol (ESTRACE) 0.5 MG tablet Take 0.25 mg by mouth every other day.    . fluticasone (FLONASE) 50 MCG/ACT nasal spray Place 1 spray into both nostrils daily.    . montelukast (SINGULAIR) 10 MG tablet Take 10 mg by mouth at bedtime.      . Nutritional Supplements (ARTHRO-COMPLEX PO) Take by mouth daily.      . Nutritional Supplements (JOINT FORMULA PO) Take by mouth daily.      Marland Kitchen. omeprazole (PRILOSEC) 40 MG capsule Take 40 mg by mouth daily.    . sertraline (ZOLOFT) 50 MG tablet Take 50 mg by mouth daily.     No current facility-administered medications for this visit.    Allergies:  Sulfa antibiotics and  Penicillins   Social History: The patient  reports that she has never smoked. She has never used smokeless tobacco. She reports that she does not drink alcohol or use drugs.   ROS:  Please see the history of present illness. Otherwise, complete review of systems is positive for seasonal allergies.  All other systems are reviewed and negative.   Physical Exam: VS:  BP 127/80   Pulse 72   Ht 5\' 2"  (1.575 m)   Wt 146 lb (66.2 kg)   BMI 26.70 kg/m , BMI Body mass index is 26.7 kg/m.  Wt Readings from Last 3 Encounters:  12/23/15 146 lb (66.2 kg)  01/01/15 145 lb (65.8 kg)  01/01/14 142 lb 8 oz (64.6 kg)    General: Patient appears comfortable at rest. HEENT: Conjunctiva and lids normal, oropharynx clear. Neck: Supple, no elevated JVP or carotid bruits, no thyromegaly. Lungs: Clear to auscultation, nonlabored breathing at rest. Cardiac: Regular rate and rhythm, no S3 or significant systolic murmur, no pericardial rub. Abdomen: Soft, nontender, bowel sounds present. Extremities: No pitting edema, distal pulses 2+.  ECG: I personally reviewed the tracing from 01/01/2015 which showed sinus rhythm with incomplete RBBB and decreased R wave progression.  Other Studies Reviewed Today:  Echocardiogram 11/11/2010 [facility]):  LVEF greater  than 65% with diastolic dysfunction, trace mitral regurgitation, trace tricuspid regurgitation, RVSP 18 mmHg.  Lexiscan Cardiolite 11/11/2010 [facility]): No diagnostic ST segment abnormalities or arrhythmias, breast attenuation artifact without clear evidence of scar or ischemia, LVEF 83%.  Assessment and Plan:  PSVT, quiescent on current regimen including Cardizem CD. I reviewed her ECG today. We will continue with observation at this point.  Current medicines were reviewed with the patient today.   Orders Placed This Encounter  Procedures  . EKG 12-Lead    Disposition: Follow-up with me in one year.  Signed, Jonelle Sidle, MD,  [facility] 12/23/2015 1:03 PM    [facility] at [facility] [address] [place], [place], [place] [address] Phone: [phone]; Fax: [phone]

## 2015-12-23 ENCOUNTER — Encounter: Payer: Self-pay | Admitting: Cardiology

## 2015-12-23 ENCOUNTER — Ambulatory Visit (INDEPENDENT_AMBULATORY_CARE_PROVIDER_SITE_OTHER): Payer: Medicare PPO | Admitting: Cardiology

## 2015-12-23 VITALS — BP 127/80 | HR 72 | Ht 62.0 in | Wt 146.0 lb

## 2015-12-23 DIAGNOSIS — Z23 Encounter for immunization: Secondary | ICD-10-CM

## 2015-12-23 DIAGNOSIS — I471 Supraventricular tachycardia: Secondary | ICD-10-CM | POA: Diagnosis not present

## 2015-12-23 NOTE — Patient Instructions (Signed)

## 2016-06-30 ENCOUNTER — Other Ambulatory Visit: Payer: Self-pay | Admitting: Cardiology

## 2016-07-28 ENCOUNTER — Other Ambulatory Visit: Payer: Self-pay | Admitting: Cardiology

## 2016-11-02 ENCOUNTER — Ambulatory Visit (INDEPENDENT_AMBULATORY_CARE_PROVIDER_SITE_OTHER): Payer: Medicare PPO | Admitting: Otolaryngology

## 2016-11-02 DIAGNOSIS — H6123 Impacted cerumen, bilateral: Secondary | ICD-10-CM | POA: Diagnosis not present

## 2016-11-02 DIAGNOSIS — J310 Chronic rhinitis: Secondary | ICD-10-CM

## 2016-11-02 DIAGNOSIS — H903 Sensorineural hearing loss, bilateral: Secondary | ICD-10-CM | POA: Diagnosis not present

## 2016-12-23 ENCOUNTER — Encounter: Payer: Self-pay | Admitting: *Deleted

## 2016-12-24 ENCOUNTER — Ambulatory Visit (INDEPENDENT_AMBULATORY_CARE_PROVIDER_SITE_OTHER): Payer: Medicare PPO | Admitting: Cardiology

## 2016-12-24 ENCOUNTER — Encounter (INDEPENDENT_AMBULATORY_CARE_PROVIDER_SITE_OTHER): Payer: Self-pay

## 2016-12-24 ENCOUNTER — Encounter: Payer: Self-pay | Admitting: Cardiology

## 2016-12-24 VITALS — BP 158/92 | HR 94 | Ht 63.0 in | Wt 147.0 lb

## 2016-12-24 DIAGNOSIS — I471 Supraventricular tachycardia: Secondary | ICD-10-CM

## 2016-12-24 NOTE — Patient Instructions (Signed)

## 2016-12-24 NOTE — Progress Notes (Signed)
Cardiology Office Note  Date: 12/24/2016   ID: Melek, [place] 1945/03/20, MRN [id number]  PCP: Louie Boston., MD  Primary Cardiologist: Nona Dell, MD   Chief Complaint  Patient presents with  . PSVT    History of Present Illness: Sheena Clay is a 71 y.o. female last seen in October 2017. She presents for a routine follow-up visit. Over the last year she does not report any significant palpitations, no chest pain or syncope. She states that she has been compliant with her medications.  She continues to follow with Dr. Margo Common. I reviewed her recent lab work which is outlined below.  Current medications include aspirin and Cartia XT.  I personally reviewed her ECG today which shows sinus rhythm with incomplete right bundle branch block.  Past Medical History:  Diagnosis Date  . Asthma   . Depression   . GERD (gastroesophageal reflux disease)   . Osteoarthritis   . PSVT (paroxysmal supraventricular tachycardia) (HCC)    Documented 2012 with syncope and urgent cardioversion    Past Surgical History:  Procedure Laterality Date  . ABDOMINAL HYSTERECTOMY    . ANKLE SURGERY Left    ORIF    Current Outpatient Prescriptions  Medication Sig Dispense Refill  . aspirin 81 MG tablet Take 81 mg by mouth daily.      . Calcium Carbonate-Vitamin D (CALCIUM 600 + D PO) Take by mouth daily.      Marland Kitchen CARTIA XT 240 MG 24 hr capsule TAKE (1) TABLET BY MOUTH ONCE DAILY. 30 capsule 6  . Cetirizine HCl (ZYRTEC PO) Take 10 mg by mouth daily.     . DULoxetine (CYMBALTA) 60 MG capsule Take 60 mg by mouth daily.      Marland Kitchen estradiol (ESTRACE) 0.5 MG tablet Take 0.25 mg by mouth every other day.    . fluticasone (FLONASE) 50 MCG/ACT nasal spray Place 1 spray into both nostrils daily.    . montelukast (SINGULAIR) 10 MG tablet Take 10 mg by mouth at bedtime.      . Nutritional Supplements (ARTHRO-COMPLEX PO) Take by mouth daily.      . Nutritional Supplements (JOINT FORMULA PO) Take by  mouth daily.      Marland Kitchen omeprazole (PRILOSEC) 40 MG capsule Take 40 mg by mouth daily.    . sertraline (ZOLOFT) 50 MG tablet Take 50 mg by mouth daily.     No current facility-administered medications for this visit.    Allergies:  Sulfa antibiotics and Penicillins   Social History: The patient  reports that she has never smoked. She has never used smokeless tobacco. She reports that she does not drink alcohol or use drugs.   ROS:  Please see the history of present illness. Otherwise, complete review of systems is positive for none.  All other systems are reviewed and negative.   Physical Exam: VS:  BP (!) 158/92   Pulse 94   Ht  (1.6 m)   Wt 147 lb (66.7 kg)   SpO2 96%   BMI 26.04 kg/m , BMI Body mass index is 26.04 kg/m.  Wt Readings from Last 3 Encounters:  12/24/16 147 lb (66.7 kg)  12/23/15 146 lb (66.2 kg)  01/01/15 145 lb (65.8 kg)    General: Patient appears comfortable at rest. HEENT: Conjunctiva and lids normal, oropharynx clear. Neck: Supple, no elevated JVP or carotid bruits, no thyromegaly. Lungs: Clear to auscultation, nonlabored breathing at rest. Cardiac: Regular rate and rhythm, no S3 or  significant systolic murmur, no pericardial rub. Abdomen: Soft, nontender, bowel sounds present, no guarding or rebound. Extremities: No pitting edema, distal pulses 2+. Skin: Warm and dry.  ECG: I personally reviewed the tracing from 12/23/2015 which showed sinus rhythm with left anterior fascicular block.  Recent Labwork:  BUN 14, creatinine 0.83, AST 28, ALT 28, cholesterol 269, HDL 51, LDL 157, triglycerides 308  Other Studies Reviewed Today:  Echocardiogram 11/11/2010 [facility]): LVEF greater than 65% with diastolic dysfunction, trace mitral regurgitation, trace tricuspid regurgitation, RVSP 18 mmHg.  Assessment and Plan:  History of PSVT, doing very well without any significant breakthrough documented over the last year, no palpitations or syncope. She continues  on [employer] ECG reviewed. Continue with annual follow-up.  Current medicines were reviewed with the patient today.   Orders Placed This Encounter  Procedures  . EKG 12-Lead    Disposition: Follow-up in one year.  Signed, Jonelle Sidle, MD, [facility] [place] [facility] 12/24/2016 1:05 PM    [facility] at [facility] [address] [place], [place], [place] [address] Phone: [phone]; Fax: [phone]

## 2016-12-28 ENCOUNTER — Ambulatory Visit (INDEPENDENT_AMBULATORY_CARE_PROVIDER_SITE_OTHER): Payer: Medicare PPO | Admitting: Otolaryngology

## 2016-12-28 DIAGNOSIS — R51 Headache: Secondary | ICD-10-CM | POA: Diagnosis not present

## 2016-12-28 DIAGNOSIS — J310 Chronic rhinitis: Secondary | ICD-10-CM | POA: Diagnosis not present

## 2016-12-30 ENCOUNTER — Other Ambulatory Visit (INDEPENDENT_AMBULATORY_CARE_PROVIDER_SITE_OTHER): Payer: Self-pay | Admitting: Otolaryngology

## 2016-12-30 DIAGNOSIS — J329 Chronic sinusitis, unspecified: Secondary | ICD-10-CM

## 2017-01-11 ENCOUNTER — Ambulatory Visit (HOSPITAL_COMMUNITY)
Admission: RE | Admit: 2017-01-11 | Discharge: 2017-01-11 | Disposition: A | Discharge status: Home / Self Care | Payer: Medicare PPO | Source: Ambulatory Visit | Attending: Otolaryngology | Admitting: Otolaryngology

## 2017-01-11 DIAGNOSIS — J329 Chronic sinusitis, unspecified: Secondary | ICD-10-CM | POA: Diagnosis present

## 2017-01-28 ENCOUNTER — Ambulatory Visit (INDEPENDENT_AMBULATORY_CARE_PROVIDER_SITE_OTHER): Payer: Medicare PPO | Admitting: Otolaryngology

## 2017-01-28 DIAGNOSIS — J342 Deviated nasal septum: Secondary | ICD-10-CM | POA: Diagnosis not present

## 2017-01-28 DIAGNOSIS — J310 Chronic rhinitis: Secondary | ICD-10-CM | POA: Diagnosis not present

## 2017-03-29 ENCOUNTER — Other Ambulatory Visit: Payer: Self-pay | Admitting: Cardiology

## 2017-06-28 ENCOUNTER — Ambulatory Visit (INDEPENDENT_AMBULATORY_CARE_PROVIDER_SITE_OTHER): Payer: Medicare PPO | Admitting: Otolaryngology

## 2017-06-28 DIAGNOSIS — H6123 Impacted cerumen, bilateral: Secondary | ICD-10-CM | POA: Diagnosis not present

## 2017-06-28 DIAGNOSIS — H9041 Sensorineural hearing loss, unilateral, right ear, with unrestricted hearing on the contralateral side: Secondary | ICD-10-CM | POA: Diagnosis not present

## 2017-07-28 ENCOUNTER — Other Ambulatory Visit: Payer: Self-pay | Admitting: Cardiology

## 2017-12-15 ENCOUNTER — Encounter: Payer: Self-pay | Admitting: *Deleted

## 2017-12-15 NOTE — Progress Notes (Signed)
Cardiology Office Note  Date: 12/16/2017   ID: Sheena, [place] 04-May-1945, MRN [id number]  PCP: Louie Boston., MD  Primary Cardiologist: Nona Dell, MD   Chief Complaint  Patient presents with  . PSVT    History of Present Illness: Sheena Clay is a 72 y.o. female last seen in October 2018.  She presents for a routine visit.  Continues to do well, no significant palpitations, no chest pain or unusual breathlessness.  No syncope.  She reports compliance with her medications.  Cardiac regimen includes Cardizem CD at 240 mg daily.  I personally reviewed her ECG today which shows sinus rhythm with probable lead artifact and leftward axis.  Past Medical History:  Diagnosis Date  . Asthma   . Depression   . GERD (gastroesophageal reflux disease)   . Osteoarthritis   . PSVT (paroxysmal supraventricular tachycardia) (HCC)    Documented 2012 with syncope and urgent cardioversion    Past Surgical History:  Procedure Laterality Date  . ABDOMINAL HYSTERECTOMY    . ANKLE SURGERY Left    ORIF    Current Outpatient Medications  Medication Sig Dispense Refill  . aspirin 81 MG tablet Take 81 mg by mouth daily.      . Calcium Carbonate-Vitamin D (CALCIUM 600 + D PO) Take by mouth daily.      . Cetirizine HCl (ZYRTEC PO) Take 10 mg by mouth daily.     Marland Kitchen diltiazem (CARDIZEM CD) 240 MG 24 hr capsule TAKE (1) TABLET BY MOUTH ONCE DAILY. 30 capsule 6  . DULoxetine (CYMBALTA) 60 MG capsule Take 60 mg by mouth daily.      Marland Kitchen estradiol (ESTRACE) 0.5 MG tablet Take 0.25 mg by mouth every other day.    . fluticasone (FLONASE) 50 MCG/ACT nasal spray Place 1 spray into both nostrils daily.    . montelukast (SINGULAIR) 10 MG tablet Take 10 mg by mouth at bedtime.      . Nutritional Supplements (ARTHRO-COMPLEX PO) Take by mouth daily.      . Nutritional Supplements (JOINT FORMULA PO) Take by mouth daily.      Marland Kitchen omeprazole (PRILOSEC) 40 MG capsule Take 40 mg by mouth daily.    .  sertraline (ZOLOFT) 50 MG tablet Take 50 mg by mouth daily.     No current facility-administered medications for this visit.    Allergies:  Sulfa antibiotics and Penicillins   Social History: The patient  reports that she has never smoked. She has never used smokeless tobacco. She reports that she does not drink alcohol or use drugs.   ROS:  Please see the history of present illness. Otherwise, complete review of systems is positive for none.  All other systems are reviewed and negative.   Physical Exam: VS:  BP 133/76   Pulse 85   Ht 5\' 3"  (1.6 m)   Wt 148 lb 3.2 oz (67.2 kg)   BMI 26.25 kg/m , BMI Body mass index is 26.25 kg/m.  Wt Readings from Last 3 Encounters:  12/16/17 148 lb 3.2 oz (67.2 kg)  12/24/16 147 lb (66.7 kg)  12/23/15 146 lb (66.2 kg)    General: Patient appears comfortable at rest. HEENT: Conjunctiva and lids normal, oropharynx clear. Neck: Supple, no elevated JVP or carotid bruits, no thyromegaly. Lungs: Clear to auscultation, nonlabored breathing at rest. Cardiac: Regular rate and rhythm, no S3 or significant systolic murmur. Abdomen: Soft, nontender, bowel sounds present. Extremities: No pitting edema, distal pulses 2+.  ECG: I personally reviewed the tracing from 12/24/2016 which showed sinus rhythm with incomplete right bundle branch block.  Other Studies Reviewed Today:  Echocardiogram 11/11/2010 [facility]): LVEF greater than 65% with diastolic dysfunction, trace mitral regurgitation, trace tricuspid regurgitation, RVSP 18 mmHg.  Assessment and Plan:  PSVT, quiescent on Cardizem CD 240 mg daily.  ECG reviewed today as well.  No significant palpitations over the last year.  Continue with observation.  Current medicines were reviewed with the patient today.   Orders Placed This Encounter  Procedures  . EKG 12-Lead    Disposition: Follow-up in 1 year.  Signed, Jonelle Sidle, MD, [facility] 12/16/2017 2:16 PM    [facility] at [facility] [address] [place], [place], [place] [address] Phone: [phone]; Fax: [phone]

## 2017-12-16 ENCOUNTER — Encounter: Payer: Self-pay | Admitting: Cardiology

## 2017-12-16 ENCOUNTER — Ambulatory Visit: Payer: Medicare PPO | Admitting: Cardiology

## 2017-12-16 VITALS — BP 133/76 | HR 85 | Ht 63.0 in | Wt 148.2 lb

## 2017-12-16 DIAGNOSIS — I471 Supraventricular tachycardia: Secondary | ICD-10-CM

## 2017-12-16 NOTE — Patient Instructions (Addendum)

## 2017-12-28 ENCOUNTER — Ambulatory Visit: Payer: Medicare PPO | Admitting: Cardiology

## 2018-02-28 ENCOUNTER — Other Ambulatory Visit: Payer: Self-pay | Admitting: Cardiology

## 2018-05-30 ENCOUNTER — Other Ambulatory Visit: Payer: Self-pay | Admitting: Cardiology

## 2018-10-13 ENCOUNTER — Ambulatory Visit (INDEPENDENT_AMBULATORY_CARE_PROVIDER_SITE_OTHER): Payer: Medicare PPO | Admitting: Otolaryngology

## 2018-10-13 DIAGNOSIS — H9041 Sensorineural hearing loss, unilateral, right ear, with unrestricted hearing on the contralateral side: Secondary | ICD-10-CM

## 2018-10-13 DIAGNOSIS — R070 Pain in throat: Secondary | ICD-10-CM | POA: Diagnosis not present

## 2018-10-13 DIAGNOSIS — K219 Gastro-esophageal reflux disease without esophagitis: Secondary | ICD-10-CM

## 2018-10-13 DIAGNOSIS — H6123 Impacted cerumen, bilateral: Secondary | ICD-10-CM

## 2019-01-05 ENCOUNTER — Telehealth: Payer: Self-pay | Admitting: Cardiology

## 2019-01-05 NOTE — Telephone Encounter (Signed)
Virtual Visit Pre-Appointment Phone Call  "(Name), I am calling you today to discuss your upcoming appointment. We are currently trying to limit exposure to the virus that causes COVID-19 by seeing patients at home rather than in the office."  1. "What is the BEST phone number to call the day of the visit?" - include this in appointment notes  2. Do you have or have access to (through a family member/friend) a smartphone with video capability that we can use for your visit?" a. If yes - list this number in appt notes as cell (if different from BEST phone #) and list the appointment type as a VIDEO visit in appointment notes b. If no - list the appointment type as a PHONE visit in appointment notes  3. Confirm consent - "In the setting of the current Covid19 crisis, you are scheduled for a (phone or video) visit with your provider on (date) at (time).  Just as we do with many in-office visits, in order for you to participate in this visit, we must obtain consent.  If you'd like, I can send this to your mychart (if signed up) or email for you to review.  Otherwise, I can obtain your verbal consent now.  All virtual visits are billed to your insurance company just like a normal visit would be.  By agreeing to a virtual visit, we'd like you to understand that the technology does not allow for your provider to perform an examination, and thus may limit your provider's ability to fully assess your condition. If your provider identifies any concerns that need to be evaluated in person, we will make arrangements to do so.  Finally, though the technology is pretty good, we cannot assure that it will always work on either your or our end, and in the setting of a video visit, we may have to convert it to a phone-only visit.  In either situation, we cannot ensure that we have a secure connection.  Are you willing to proceed?" STAFF: Did the patient verbally acknowledge consent to telehealth visit? Document  YES/NO here: yes  4. Advise patient to be prepared - "Two hours prior to your appointment, go ahead and check your blood pressure, pulse, oxygen saturation, and your weight (if you have the equipment to check those) and write them all down. When your visit starts, your provider will ask you for this information. If you have an Apple Watch or Kardia device, please plan to have heart rate information ready on the day of your appointment. Please have a pen and paper handy nearby the day of the visit as well."  5. Give patient instructions for MyChart download to smartphone OR Doximity/Doxy.me as below if video visit (depending on what platform provider is using)  6. Inform patient they will receive a phone call 15 minutes prior to their appointment time (may be from unknown caller ID) so they should be prepared to answer    TELEPHONE CALL NOTE  Sheena Clay has been deemed a candidate for a follow-up tele-health visit to limit community exposure during the Covid-19 pandemic. I spoke with the patient via phone to ensure availability of phone/video source, confirm preferred email & phone number, and discuss instructions and expectations.  I reminded Sheena Clay to be prepared with any vital sign and/or heart rhythm information that could potentially be obtained via home monitoring, at the time of her visit. I reminded Sheena Clay to expect a phone call prior to  her visit.  Geraldine Contras 01/05/2019 3:24 PM   INSTRUCTIONS FOR DOWNLOADING THE MYCHART APP TO SMARTPHONE  - The patient must first make sure to have activated MyChart and know their login information - If Apple, go to [employer] and type in MyChart in the search bar and download the app. If Android, ask patient to go to [employer] and type in [place] in the search bar and download the app. The app is free but as with any other app downloads, their phone may require them to verify saved payment information or Apple/Android  password.  - The patient will need to then log into the app with their MyChart username and password, and select [facility] as their healthcare provider to link the account. When it is time for your visit, go to the MyChart app, find appointments, and click Begin Video Visit. Be sure to Select Allow for your device to access the Microphone and Camera for your visit. You will then be connected, and your provider will be with you shortly.  **If they have any issues connecting, or need assistance please contact MyChart service desk [phone])**  **If using a computer, in order to ensure the best quality for their visit they will need to use either of the following Internet Browsers: [employer] or Google Chrome**  IF USING DOXIMITY or DOXY.ME - The patient will receive a link just prior to their visit by text.     FULL LENGTH CONSENT FOR TELE-HEALTH VISIT   I hereby voluntarily request, consent and authorize [facility] and its employed or contracted physicians, physician assistants, nurse practitioners or other licensed health care professionals (the Practitioner), to provide me with telemedicine health care services (the Services") as deemed necessary by the treating Practitioner. I acknowledge and consent to receive the Services by the Practitioner via telemedicine. I understand that the telemedicine visit will involve communicating with the Practitioner through live audiovisual communication technology and the disclosure of certain medical information by electronic transmission. I acknowledge that I have been given the opportunity to request an in-person assessment or other available alternative prior to the telemedicine visit and am voluntarily participating in the telemedicine visit.  I understand that I have the right to withhold or withdraw my consent to the use of telemedicine in the course of my care at any time, without affecting my right to future care or treatment,  and that the Practitioner or I may terminate the telemedicine visit at any time. I understand that I have the right to inspect all information obtained and/or recorded in the course of the telemedicine visit and may receive copies of available information for a reasonable fee.  I understand that some of the potential risks of receiving the Services via telemedicine include:   Delay or interruption in medical evaluation due to technological equipment failure or disruption;  Information transmitted may not be sufficient (e.g. poor resolution of images) to allow for appropriate medical decision making by the Practitioner; and/or   In rare instances, security protocols could fail, causing a breach of personal health information.  Furthermore, I acknowledge that it is my responsibility to provide information about my medical history, conditions and care that is complete and accurate to the best of my ability. I acknowledge that Practitioner's advice, recommendations, and/or decision may be based on factors not within their control, such as incomplete or inaccurate data provided by me or distortions of diagnostic images or specimens that may result from electronic transmissions. I  understand that the practice of medicine is not an exact science and that Practitioner makes no warranties or guarantees regarding treatment outcomes. I acknowledge that I will receive a copy of this consent concurrently upon execution via email to the email address I last provided but may also request a printed copy by calling the office of [place].    I understand that my insurance will be billed for this visit.   I have read or had this consent read to me.  I understand the contents of this consent, which adequately explains the benefits and risks of the Services being provided via telemedicine.   I have been provided ample opportunity to ask questions regarding this consent and the Services and have had my questions  answered to my satisfaction.  I give my informed consent for the services to be provided through the use of telemedicine in my medical care  By participating in this telemedicine visit I agree to the above.

## 2019-01-10 ENCOUNTER — Encounter: Payer: Self-pay | Admitting: *Deleted

## 2019-01-11 ENCOUNTER — Telehealth (INDEPENDENT_AMBULATORY_CARE_PROVIDER_SITE_OTHER): Payer: Medicare PPO | Admitting: Cardiology

## 2019-01-11 ENCOUNTER — Encounter: Payer: Self-pay | Admitting: Cardiology

## 2019-01-11 VITALS — Ht 63.0 in | Wt 146.0 lb

## 2019-01-11 DIAGNOSIS — I471 Supraventricular tachycardia: Secondary | ICD-10-CM | POA: Diagnosis not present

## 2019-01-11 NOTE — Patient Instructions (Addendum)

## 2019-01-11 NOTE — Progress Notes (Signed)
Virtual Visit via Telephone Note   This visit type was conducted due to national recommendations for restrictions regarding the COVID-19 Pandemic (e.g. social distancing) in an effort to limit this patient's exposure and mitigate transmission in our community.  Due to her co-morbid illnesses, this patient is at least at moderate risk for complications without adequate follow up.  This format is felt to be most appropriate for this patient at this time.  The patient did not have access to video technology/had technical difficulties with video requiring transitioning to audio format only (telephone).  All issues noted in this document were discussed and addressed.  No physical exam could be performed with this format.  Please refer to the patient's chart for her  consent to telehealth for [facility].  Date:  01/11/2019    ID:  Sheena Clay, DOB [birth year], MRN [id number]  Patient Location: Home Provider Location: Office  PCP:  Kela Millin, MD  Cardiologist:  Nona Dell, MD Electrophysiologist:  None   Evaluation Performed:  Follow-Up Visit  Chief Complaint:   Cardiac follow-up  History of Present Illness:    Sheena Clay is a 73 y.o. female last seen in October 2019.  We spoke by phone today.  She tells me that she has not experienced any significant palpitations in the last year.  She continues on Cardizem CD at 240 mg daily which has been fairly effective over time.  She is due to see her PCP soon for a follow-up physical with lab work.  I did review her lab work from last October.  The patient does not have symptoms concerning for COVID-19 infection (fever, chills, cough, or new shortness of breath).  She tells me that she has been wearing a mask when she goes out.   Past Medical History:  Diagnosis Date  . Asthma   . Depression   . GERD (gastroesophageal reflux disease)   . Osteoarthritis   . PSVT (paroxysmal supraventricular tachycardia) (HCC)    Documented  2012 with syncope and urgent cardioversion   Past Surgical History:  Procedure Laterality Date  . ABDOMINAL HYSTERECTOMY    . ANKLE SURGERY Left    ORIF     Current Meds  Medication Sig  . aspirin 81 MG tablet Take 81 mg by mouth daily.    . Calcium Carbonate-Vitamin D (CALCIUM 600 + D PO) Take by mouth daily.    . Cetirizine HCl (ZYRTEC PO) Take 10 mg by mouth daily.   Marland Kitchen diltiazem (CARDIZEM CD) 240 MG 24 hr capsule TAKE 1 CAPSULE DAILY.  . DULoxetine (CYMBALTA) 60 MG capsule Take 60 mg by mouth daily.    . fluticasone (FLONASE) 50 MCG/ACT nasal spray Place 1 spray into both nostrils daily as needed.   . montelukast (SINGULAIR) 10 MG tablet Take 10 mg by mouth at bedtime.    . Nutritional Supplements (ARTHRO-COMPLEX PO) Take by mouth daily.    . Nutritional Supplements (JOINT FORMULA PO) Take by mouth daily.    Marland Kitchen omeprazole (PRILOSEC) 40 MG capsule Take 40 mg by mouth daily.  . sertraline (ZOLOFT) 50 MG tablet Take 50 mg by mouth daily.     Allergies:   Sulfa antibiotics and Penicillins   Social History   Tobacco Use  . Smoking status: Never Smoker  . Smokeless tobacco: Never Used  Substance Use Topics  . Alcohol use: No    Alcohol/week: 0.0 standard drinks  . Drug use: No     Family Hx: The  patient's family history includes Other in an other family member; Ovarian cancer in her mother.  ROS:   Please see the history of present illness. All other systems reviewed and are negative.   Prior CV studies:   The following studies were reviewed today:  Echocardiogram 11/11/2010 [facility]): LVEF greater than 54% with diastolic dysfunction, trace mitral regurgitation, trace tricuspid regurgitation, RVSP 18 mmHg.  Labs/Other Tests and Data Reviewed:    EKG:  An ECG dated 12/16/2017 was personally reviewed today and demonstrated:  Sinus rhythm with lead artifact and leftward axis.  Recent Labs:  October 2019: Hemoglobin 14.3, platelets 277, BUN 16, creatinine 0.94,  potassium 103, AST 29, ALT 36, hemoglobin A1c 5.7%, cholesterol 275, triglycerides 169, HDL 59, LDL 182  Wt Readings from Last 3 Encounters:  01/11/19 146 lb (66.2 kg)  12/16/17 148 lb 3.2 oz (67.2 kg)  12/24/16 147 lb (66.7 kg)     Objective:    Vital Signs:  Ht 5\' 3"  (1.6 m)   Wt 146 lb (66.2 kg)   BMI 25.86 kg/m    She did not have a way to check vital signs today. Patient spoke in full sentences, not short of breath. No audible wheezing or coughing. Speech pattern normal.  ASSESSMENT & PLAN:    History of PSVT.  She continues to do well without obvious breakthrough events on Cardizem CD.  We will continue with observation and plan a follow-up ECG for next years visit, sooner if symptoms intervene.  COVID-19 Education: The signs and symptoms of COVID-19 were discussed with the patient and how to seek care for testing (follow up with PCP or arrange E-visit).  The importance of social distancing was discussed today.  Time:   Today, I have spent 5 minutes with the patient with telehealth technology discussing the above problems.     Medication Adjustments/Labs and Tests Ordered: Current medicines are reviewed at length with the patient today.  Concerns regarding medicines are outlined above.   Tests Ordered: No orders of the defined types were placed in this encounter.   Medication Changes: No orders of the defined types were placed in this encounter.   Follow Up:  In Person 1 year in the [place] office.  Signed, Rozann Lesches, MD  01/11/2019 2:21 PM    [place]

## 2019-01-25 ENCOUNTER — Other Ambulatory Visit: Payer: Self-pay | Admitting: *Deleted

## 2019-01-25 MED ORDER — DILTIAZEM HCL ER COATED BEADS 240 MG PO CP24: 240.0000 mg | ORAL_CAPSULE | Freq: Every day | ORAL | 3 refills | Status: DC

## 2019-04-03 IMAGING — CT CT MAXILLOFACIAL W/O CM
3 series · 12 of 47 positions shown, 14 images · non-contrast
Comparison: None.

CLINICAL DATA: Maxillary sinus pain and pressure with drainage for
6 months. Chronic sinusitis, unspecified location.

EXAM:
CT MAXILLOFACIAL WITHOUT CONTRAST
TECHNIQUE: Multidetector CT images of the paranasal sinuses were obtained using
the standard protocol without intravenous contrast.

[Series 2: standard · axial · 0.32mm/px · z∈[+180,+264]mm · 6 of 109 slices shown, 8 images]
[im 12/109  brain]
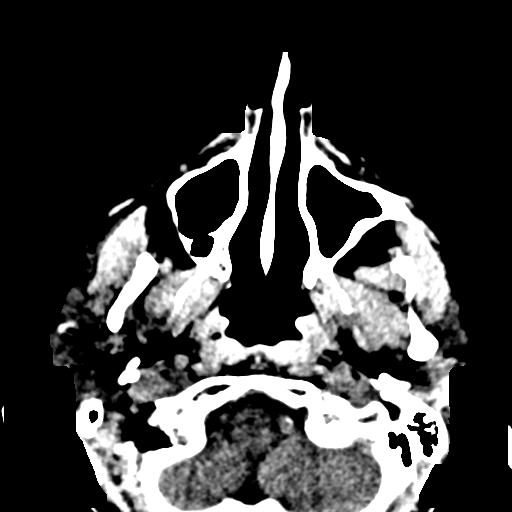
[im 12/109  bone]
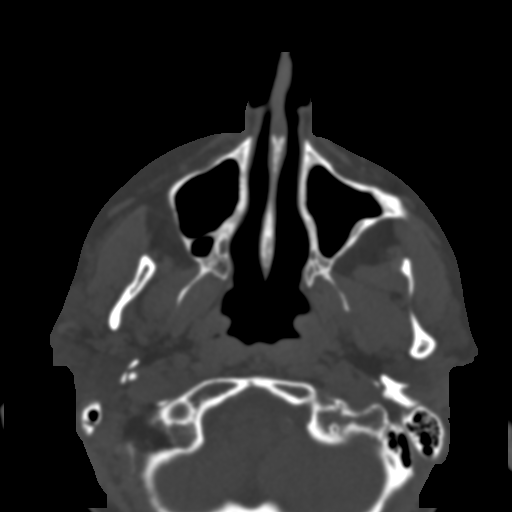
[im 30/109  bone]
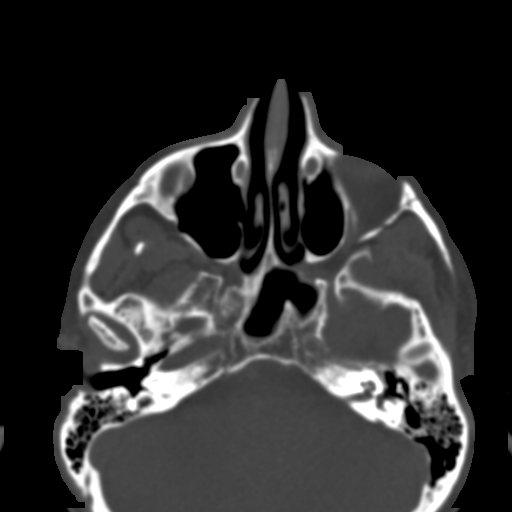
[im 45/109  bone]
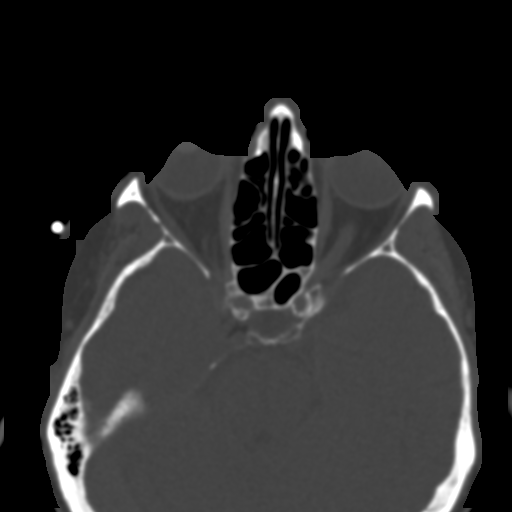
[im 64/109  bone]
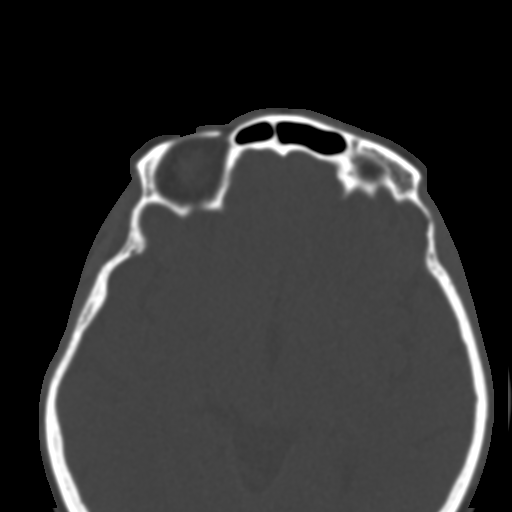
[im 82/109  brain]
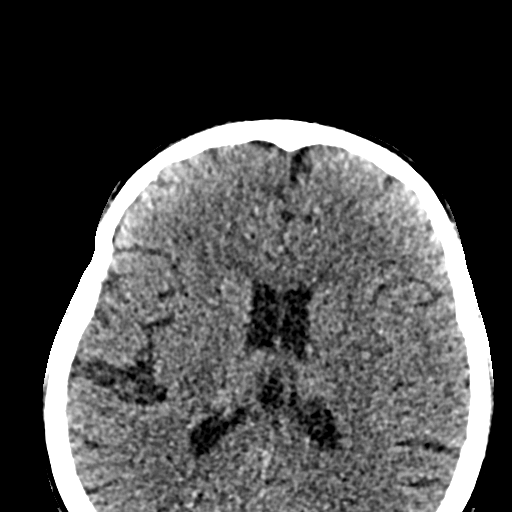
[im 82/109  bone]
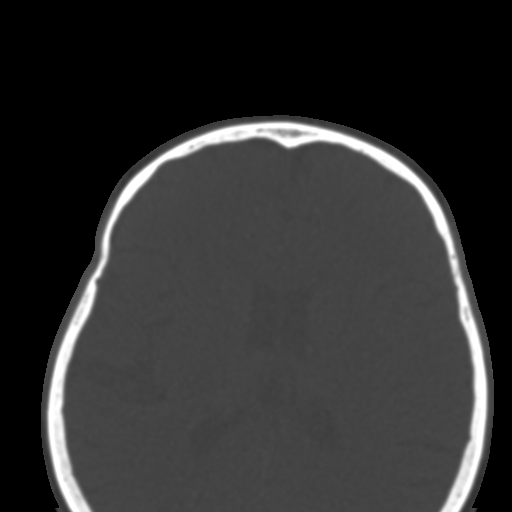
[im 97/109  bone]
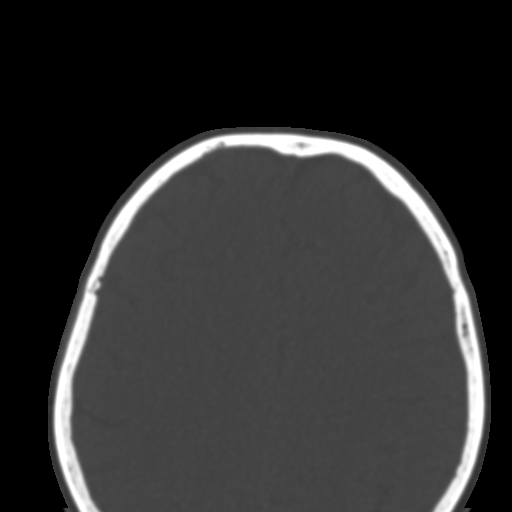

[Series 4: coronal · coronal · 0.21mm/px · 3 of 101 slices shown]
[im 34/101  bone]
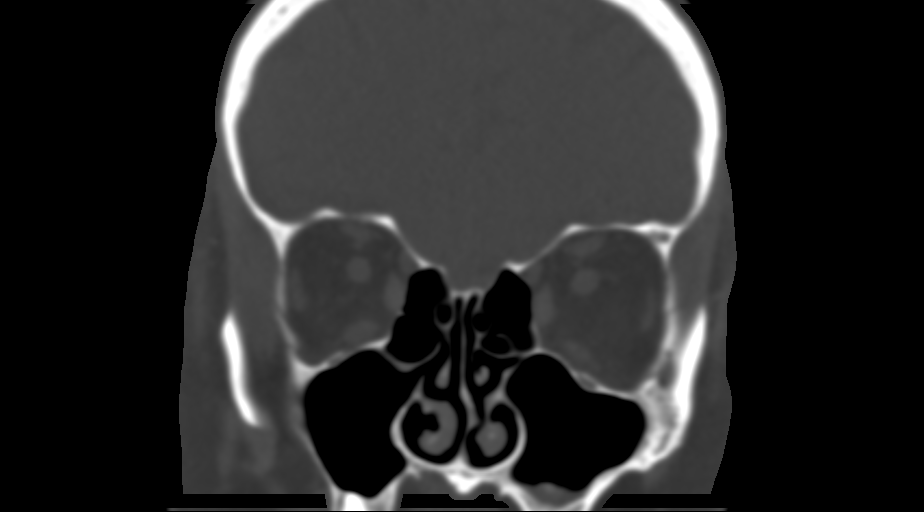
[im 45/101  bone]
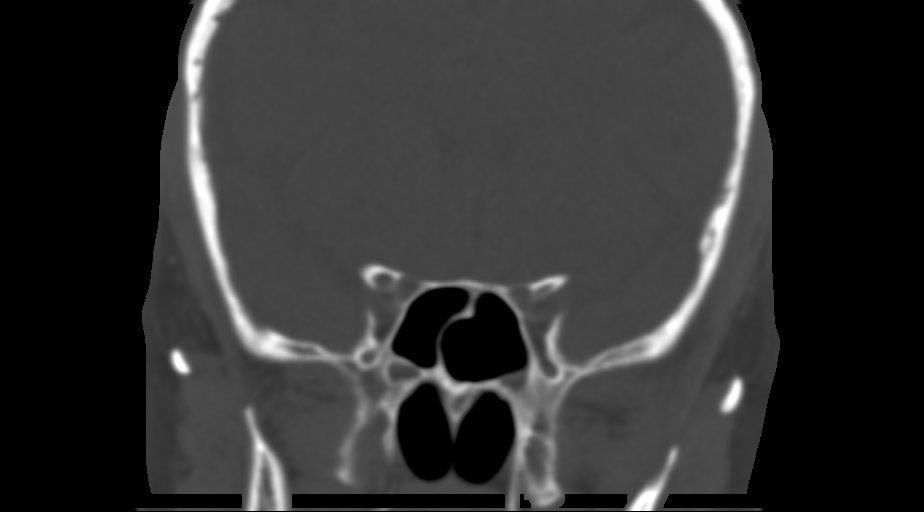
[im 56/101  bone]
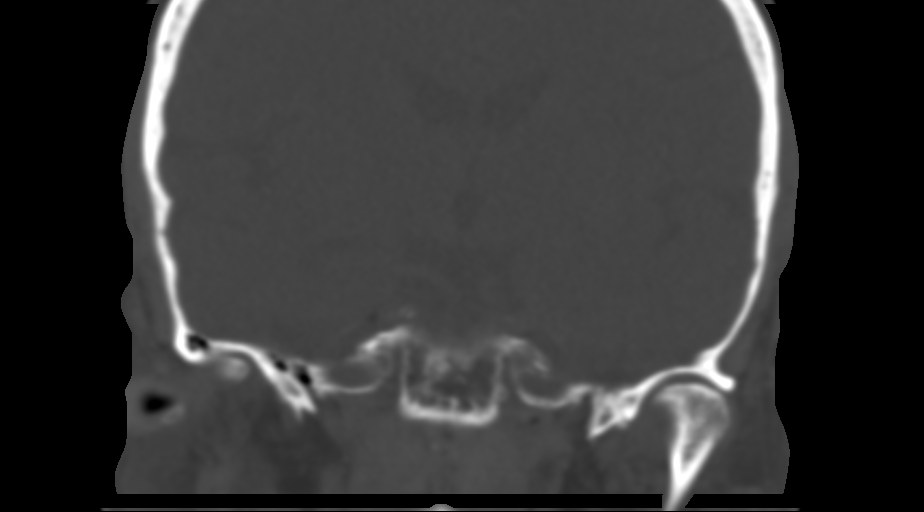

[Series 5: sagittal · sagittal · 0.21mm/px · 3 of 80 slices shown]
[im 27/80  bone]
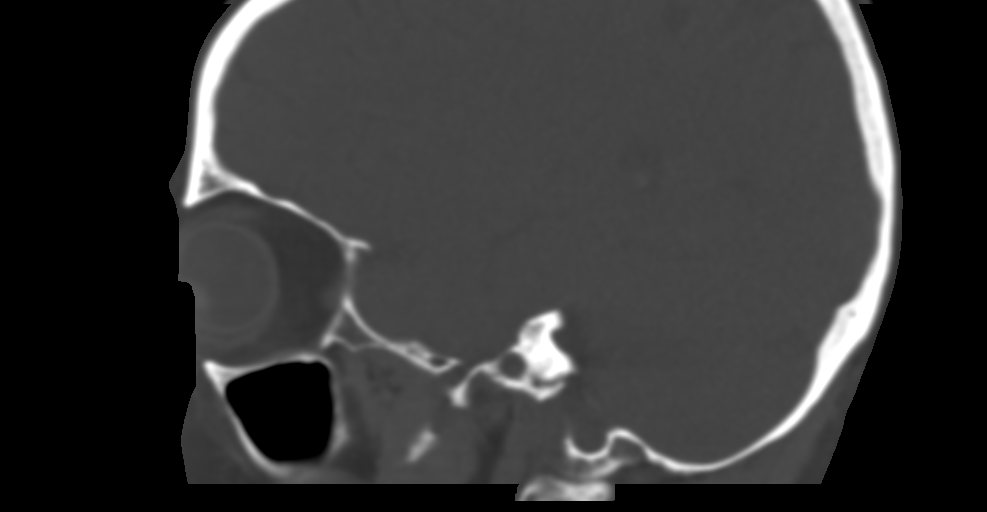
[im 40/80  bone]
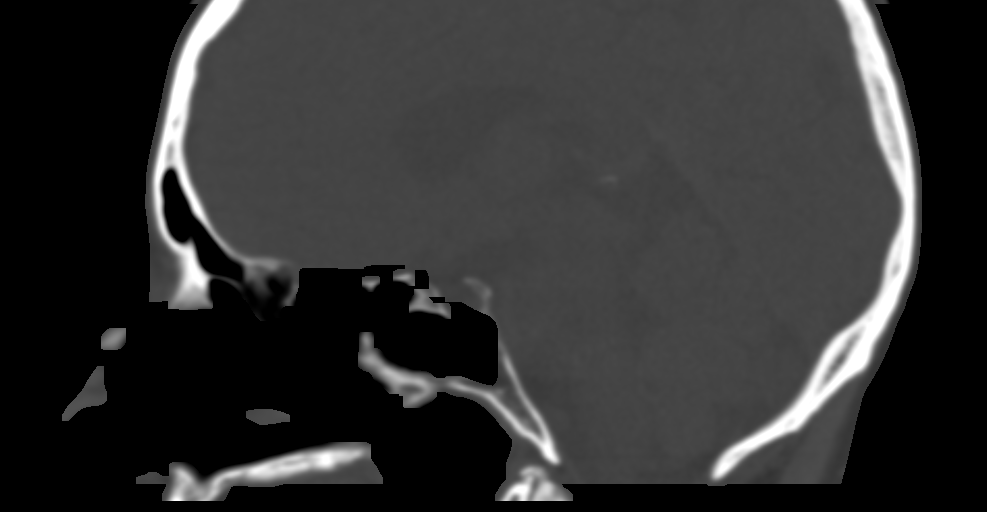
[im 53/80  bone]
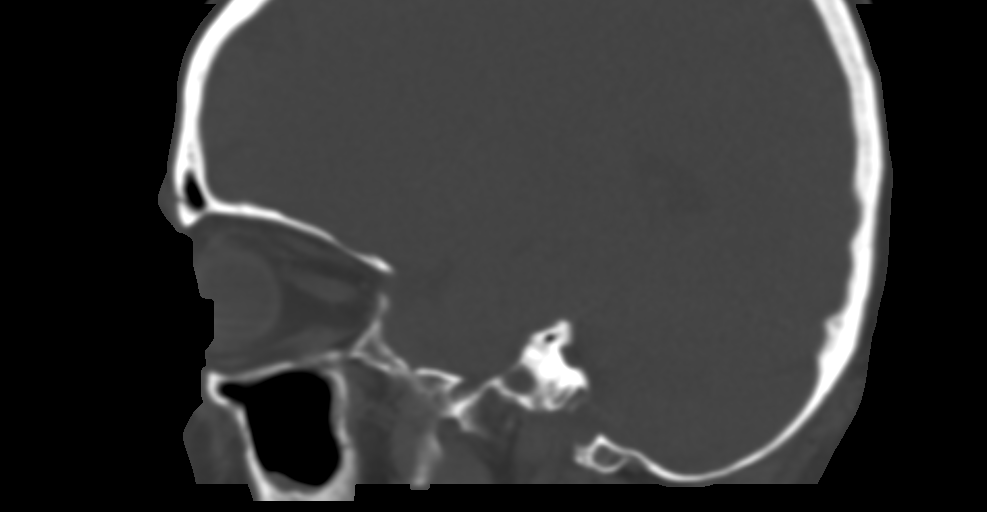

[12 of 47 positions shown; findings below may reference images not displayed]

FINDINGS: Paranasal sinuses:

Frontal: Normally aerated. Patent frontal sinus drainage pathways.

Ethmoid: There is minimal opacification of a single anterior right
ethmoid air cell.

Maxillary: Minimal mucosal thickening is present along the floor of
the left maxillary sinus. Right maxillary sinus is clear. Wall
thickening posteriorly on the left suggests history of chronic
recurrent disease.

Sphenoid: Small fluid levels are present bilaterally.

Right ostiomeatal unit: Patent. A prominent ethmoid bulla deviates
the ostiomeatal complex medially.

Left ostiomeatal unit: Patent. A prominent ethmoid bulla deviates
the ostiomeatal complex medially.

Nasal passages: Patent. Leftward nasal septal spurring extends 3 mm
from the midline.

Anatomy: No pneumatization superior to anterior ethmoid notches.
Symmetric and intact olfactory grooves and fovea ethmoidalis, Keros
II (4-7mm) Presellar sphenoid pneumatization pattern. No dehiscence
of carotid or optic canals. No onodi cell.

Other: A 5 mm calcification is noted in the right frontal lobe.
There is no associated edema or mass effect. Limited imaging the
brain is otherwise unremarkable. The globes and orbits are within
normal limits. Soft tissues the face are unremarkable. TMJ is within
normal limits bilaterally.

Osseous: Facial bones are otherwise within normal limits.
IMPRESSION: 1. Small fluid levels in the sphenoid sinuses bilaterally suggesting
mild acute sinusitis.
2. Minimal mucosal thickening in the inferior left maxillary sinus.
There is evidence for recurrent or chronic disease with posterior
wall thickening of the left maxillary sinus.
3. Minimal anterior right ethmoid sinus disease.
4. Prominent ethmoid bulla bilaterally without obstruction.
5. Calcification anteriorly within the right frontal lobe white
matter. This likely reflects remote trauma or infection. No edema to
suggest active to use or significant mass.

## 2019-11-28 ENCOUNTER — Other Ambulatory Visit: Payer: Self-pay | Admitting: Cardiology

## 2020-01-01 ENCOUNTER — Other Ambulatory Visit: Payer: Self-pay | Admitting: Cardiology

## 2020-01-23 ENCOUNTER — Other Ambulatory Visit: Payer: Self-pay | Admitting: Cardiology

## 2020-02-20 ENCOUNTER — Other Ambulatory Visit: Payer: Self-pay | Admitting: Cardiology

## 2020-02-20 MED ORDER — DILTIAZEM HCL ER COATED BEADS 240 MG PO CP24: ORAL_CAPSULE | ORAL | 1 refills | Status: DC

## 2020-02-20 NOTE — Telephone Encounter (Signed)
Refilled diltaizem 240 mg qd to [facility]

## 2020-02-20 NOTE — Telephone Encounter (Signed)
New message      *STAT* If patient is at the pharmacy, call can be transferred to refill team.   1. Which medications need to be refilled? (please list name of each medication and dose if known) diltiazem (CARDIZEM CD) 240 MG 24 hr capsule 2. Which pharmacy/location (including street and city if local pharmacy) is medication to be sent to? [facility]   3. Do they need a 30 day or 90 day supply? 90

## 2020-04-08 ENCOUNTER — Encounter: Payer: Self-pay | Admitting: Cardiology

## 2020-04-08 ENCOUNTER — Ambulatory Visit (INDEPENDENT_AMBULATORY_CARE_PROVIDER_SITE_OTHER): Payer: Medicare PPO | Admitting: Cardiology

## 2020-04-08 ENCOUNTER — Other Ambulatory Visit: Payer: Self-pay

## 2020-04-08 VITALS — BP 132/68 | HR 86 | Ht 61.5 in | Wt 135.0 lb

## 2020-04-08 DIAGNOSIS — Z789 Other specified health status: Secondary | ICD-10-CM | POA: Diagnosis not present

## 2020-04-08 DIAGNOSIS — I471 Supraventricular tachycardia: Secondary | ICD-10-CM | POA: Diagnosis not present

## 2020-04-08 DIAGNOSIS — E782 Mixed hyperlipidemia: Secondary | ICD-10-CM

## 2020-04-08 NOTE — Patient Instructions (Addendum)
.  Medication Instructions:   Your physician recommends that you continue on your current medications as directed. Please refer to the Current Medication list given to you today.  We will check coverage/prices for repatha and praluent-we will notify you when this is done.   Labwork:  none  Testing/Procedures:  none  Follow-Up:  Your physician recommends that you schedule a follow-up appointment in: 1 year. You will receive a reminder letter in the mail in about 10 months reminding you to call and schedule your appointment. If you don't receive this letter, please contact our office.  Any Other Special Instructions Will Be Listed Below (If Applicable).  If you need a refill on your cardiac medications before your next appointment, please call your pharmacy.

## 2020-04-08 NOTE — Progress Notes (Signed)
Cardiology Office Note  Date: 04/08/2020   ID: Sheena Clay, Sheena Clay 11-May-1945, MRN [id number]  PCP:  Suzan Slick, MD  Cardiologist:  Nona Dell, MD Electrophysiologist:  None   Chief Complaint  Patient presents with  . Cardiac follow-up    History of Present Illness: Sheena Clay is a 75 y.o. female last assessed via telehealth encounter in October 2020.  She presents for a follow-up visit.  She reports only occasional palpitations, no progressive symptoms, no syncope.  Her husband passed away about a year ago, she is now living closer to family in [place].  I reviewed her medications which are outlined below.  She was started on TriCor by PCP with lipid panel from November outlined below.  She has a history of multistatin intolerance otherwise, reports recurrent trouble with myalgias and joint pain.  Her LDL was 198.  I personally reviewed her ECG today which shows sinus rhythm with possible arm lead reversal, R' in lead V1 and V2.  Past Medical History:  Diagnosis Date  . Asthma   . Depression   . GERD (gastroesophageal reflux disease)   . Osteoarthritis   . PSVT (paroxysmal supraventricular tachycardia) (HCC)    Documented 2012 with syncope and urgent cardioversion    Past Surgical History:  Procedure Laterality Date  . ABDOMINAL HYSTERECTOMY    . ANKLE SURGERY Left    ORIF    Current Outpatient Medications  Medication Sig Dispense Refill  . aspirin 81 MG tablet Take 81 mg by mouth daily.    . Calcium Carbonate-Vitamin D (CALCIUM 600 + D PO) Take by mouth daily.    . Cetirizine HCl (ZYRTEC PO) Take 10 mg by mouth daily.     Marland Kitchen diltiazem (CARDIZEM CD) 240 MG 24 hr capsule TAKE 1 CAPSULE DAILY 90 capsule 1  . DULoxetine (CYMBALTA) 60 MG capsule Take 60 mg by mouth daily.    . fenofibrate (TRICOR) 145 MG tablet Take 145 mg by mouth daily.    . fluticasone (FLONASE) 50 MCG/ACT nasal spray Place 1 spray into both nostrils daily as needed.     .  montelukast (SINGULAIR) 10 MG tablet Take 10 mg by mouth at bedtime.    . Nutritional Supplements (ARTHRO-COMPLEX PO) Take by mouth daily.    . Nutritional Supplements (JOINT FORMULA PO) Take by mouth daily.    Marland Kitchen omeprazole (PRILOSEC) 40 MG capsule Take 40 mg by mouth daily.    . sertraline (ZOLOFT) 50 MG tablet Take 50 mg by mouth daily.     No current facility-administered medications for this visit.   Allergies:  Sulfa antibiotics and Penicillins   ROS: No syncope.  Physical Exam: VS:  BP 132/68   Pulse 86   Ht 5' 1.5" (1.562 m)   Wt 135 lb (61.2 kg)   SpO2 93%   BMI 25.09 kg/m , BMI Body mass index is 25.09 kg/m.  Wt Readings from Last 3 Encounters:  04/08/20 135 lb (61.2 kg)  01/11/19 146 lb (66.2 kg)  12/16/17 148 lb 3.2 oz (67.2 kg)    General: Patient appears comfortable at rest. HEENT: Conjunctiva and lids normal, wearing a mask. Neck: Supple, no elevated JVP or carotid bruits, no thyromegaly. Lungs: Clear to auscultation, nonlabored breathing at rest. Cardiac: Regular rate and rhythm, no S3 or significant systolic murmur. Extremities: No pitting edema.  ECG:  An ECG dated 12/16/2017 was personally reviewed today and demonstrated:  Sinus rhythm with lead artifact and leftward axis.  Recent Labwork:  November 2021: Hemoglobin 14.5, platelets 287, potassium 5.1, BUN 14, creatinine 0.94, AST 38, ALT 52, cholesterol 304, triglycerides 226, HDL 68, LDL 193  Other Studies Reviewed Today:  Echocardiogram 11/11/2010 [facility]): LVEF greater than 65% with diastolic dysfunction, trace mitral regurgitation, trace tricuspid regurgitation, RVSP 18 mmHg.  Assessment and Plan:  1.  PSVT, doing well on Cardizem CD.  ECG reviewed and stable.  Continue with observation.  2.  Severe mixed hyperlipidemia with multistatin intolerance.  She was placed on fenofibrate by PCP based on lab work from November 2021. At that time LDL 193 and total cholesterol 304.  We will check on  coverage for Praluent or Repatha.  Medication Adjustments/Labs and Tests Ordered: Current medicines are reviewed at length with the patient today.  Concerns regarding medicines are outlined above.   Tests Ordered: Orders Placed This Encounter  Procedures  . EKG 12-Lead    Medication Changes: No orders of the defined types were placed in this encounter.   Disposition:  Follow up 1 year in the [place] office.  Signed, Jonelle Sidle, MD, [facility] 04/08/2020 4:11 PM    [facility] at [facility] [address] [place], [place], [place] [address] Phone: [phone]; Fax: [phone]

## 2020-04-11 ENCOUNTER — Telehealth: Payer: Self-pay | Admitting: *Deleted

## 2020-04-11 NOTE — Telephone Encounter (Signed)
-----   Message from Rosalee Kaufman, RPH-CPP sent at 04/08/2020  4:55 PM EST ----- Regarding: RE: coverage for repatha and praluent Looks like Repatha is the preferred product on her plan, $45/month.  Does have a $95 up front brand name deductible that may appear with first fill  Belenda Cruise ----- Message ----- From: Eustace Moore, RN Sent: 04/08/2020   4:13 PM EST To: [facility] Subject: coverage for repatha and praluent              Dr. Diona Browner asked me to check with you all to see if you could tell us which one of these drugs would be covered under this patient's insurance. Dx: PSVT with LDL of 198 Total Cholesterol >300 and has multi-statin intolerances She is currently on fenofibrate but he said that would not be enough

## 2020-04-11 NOTE — Telephone Encounter (Signed)
Patient informed, agrees to start repatha and verbalized understanding of plan.

## 2020-04-11 NOTE — Telephone Encounter (Signed)
Thank you for the update, would go ahead and start Repatha since this is well covered.

## 2020-04-12 MED ORDER — REPATHA 140 MG/ML ~~LOC~~ SOSY: 140.0000 mg | PREFILLED_SYRINGE | SUBCUTANEOUS | 6 refills | Status: DC

## 2020-04-17 ENCOUNTER — Telehealth: Payer: Self-pay | Admitting: *Deleted

## 2020-04-17 NOTE — Telephone Encounter (Signed)
Prior auth. For Repatha completed and approved.

## 2020-04-25 ENCOUNTER — Telehealth: Payer: Self-pay | Admitting: Cardiology

## 2020-04-25 NOTE — Telephone Encounter (Signed)
Please give pt a call -- she received her Repatha in the mail and she's needing someone to tell her what she needs to do.   [phone]

## 2020-04-26 NOTE — Telephone Encounter (Signed)
Sheena Clay  Patient lives in [place] and would prefer to have someone show her how to use the first time.  She doesn't want to drive all the way to [place].  Can you reach out to her and figure out a time that she could come to [place]?  To use the device, just pull the red cap off the end and put the yellow exposed tip against the skin in the belly.  You have to be sure that the yellow tip is pushed up snug against the skin (you can feel it move) then push the button on top of the pen.   It takes about 10-15 seconds, so don't let the patient pull it away from the skin until you hear it pop/click.  (you can watch a You Tube video on this !)  Also, when you send messages to me, could you send them to the PharmD pool instead?  That way if I'm not in the office, one of the other pharmacists will get your message.   Have a good weekend!  Belenda Cruise

## 2020-04-26 NOTE — Telephone Encounter (Signed)
Spoke with pt and she will come to [place] on Tuesday 2/15 @ 9am - will forward further PharmD questions to the pool - thank you!

## 2020-04-30 ENCOUNTER — Ambulatory Visit (INDEPENDENT_AMBULATORY_CARE_PROVIDER_SITE_OTHER): Payer: Medicare PPO | Admitting: *Deleted

## 2020-04-30 DIAGNOSIS — E782 Mixed hyperlipidemia: Secondary | ICD-10-CM

## 2020-04-30 NOTE — Progress Notes (Signed)
Presents for teaching of repatha injections. Brought repatha 140 mg injection to office. Detailed instructions for injection read showed to patient using teach back. Advised if she had further questions to give office a call.

## 2020-09-06 ENCOUNTER — Other Ambulatory Visit: Payer: Self-pay | Admitting: Cardiology

## 2020-09-09 ENCOUNTER — Other Ambulatory Visit: Payer: Self-pay | Admitting: *Deleted

## 2020-09-09 MED ORDER — REPATHA 140 MG/ML ~~LOC~~ SOSY
140.0000 mg | PREFILLED_SYRINGE | SUBCUTANEOUS | 3 refills | Status: AC
Start: 2020-09-09 — End: ?

## 2020-10-15 ENCOUNTER — Telehealth: Payer: Self-pay | Admitting: Cardiology

## 2020-10-15 ENCOUNTER — Other Ambulatory Visit: Payer: Self-pay | Admitting: *Deleted

## 2020-10-15 DIAGNOSIS — Z79899 Other long term (current) drug therapy: Secondary | ICD-10-CM

## 2020-10-15 DIAGNOSIS — E782 Mixed hyperlipidemia: Secondary | ICD-10-CM

## 2020-10-15 NOTE — Telephone Encounter (Signed)
Patient informed and verbalized understanding of plan. Lab order faxed to [facility] 

## 2020-10-15 NOTE — Telephone Encounter (Signed)
Reports starting repatha in March 2022 and wanting to know when she should have her lipids rechecked. Advised will forward to provider

## 2020-10-15 NOTE — Telephone Encounter (Signed)
Pt has questions regarding her Repatha   Please give pt a call

## 2020-10-21 ENCOUNTER — Telehealth: Payer: Self-pay | Admitting: *Deleted

## 2020-10-21 NOTE — Telephone Encounter (Signed)
-----   Message from Ellsworth Lennox, [place] sent at 10/18/2020  9:20 AM EDT ----- Covering for Dr. Diona Browner - Please let the patient know her cholesterol has significantly improved since starting Repatha! Total cholesterol was previously elevated to 304 and is now down to 206. Triglycerides previously at 226 and now down to 121 and LDL previously at 193 and now down to 105. Continue current medication regimen. Please forward a copy of results to Suzan Slick, MD.

## 2020-10-21 NOTE — Telephone Encounter (Signed)
Patient informed. Copy sent to PCP °

## 2021-01-23 DIAGNOSIS — E782 Mixed hyperlipidemia: Secondary | ICD-10-CM | POA: Insufficient documentation

## 2021-01-24 DIAGNOSIS — R7303 Prediabetes: Secondary | ICD-10-CM | POA: Insufficient documentation

## 2021-04-14 NOTE — Progress Notes (Signed)
Cardiology Office Note  Date: 04/15/2021   ID: Sheena Clay, Sheena Clay 1945-12-02, MRN [id number]  PCP:  [facility]  Cardiologist:  Nona Dell, MD Electrophysiologist:  None   Chief Complaint  Patient presents with   Cardiac follow-up    History of Present Illness: Sheena Clay is a 76 y.o. female last seen in January 2022.  She is here for a routine visit.  She reports an occasional sense of palpitations, no prolonged episodes or ER visits however.  She has continued on Cardizem CD with good control of her PSVT.  I personally reviewed her ECG today which shows normal sinus rhythm.  Follow-up lipids from October 2022 are noted below.  She has been on Repatha.  She has a PCP in [place], we are requesting her lab work for review.  Past Medical History:  Diagnosis Date   Asthma    Depression    GERD (gastroesophageal reflux disease)    Mixed hyperlipidemia    Osteoarthritis    PSVT (paroxysmal supraventricular tachycardia) (HCC)    Documented 2012 with syncope and urgent cardioversion    Past Surgical History:  Procedure Laterality Date   ABDOMINAL HYSTERECTOMY     ANKLE SURGERY Left    ORIF    Current Outpatient Medications  Medication Sig Dispense Refill   aspirin 81 MG tablet Take 81 mg by mouth daily.     Calcium Carbonate-Vitamin D (CALCIUM 600 + D PO) Take by mouth daily.     Cetirizine HCl (ZYRTEC PO) Take 10 mg by mouth daily.      diltiazem (CARDIZEM CD) 240 MG 24 hr capsule TAKE 1 CAPSULE EVERY DAY 90 capsule 1   DULoxetine (CYMBALTA) 60 MG capsule Take 60 mg by mouth daily.     Evolocumab (REPATHA) 140 MG/ML SOSY Inject 140 mg into the skin every 14 (fourteen) days. 6 mL 3   fenofibrate (TRICOR) 145 MG tablet Take 145 mg by mouth daily.     fluticasone (FLONASE) 50 MCG/ACT nasal spray Place 1 spray into both nostrils daily as needed.      montelukast (SINGULAIR) 10 MG tablet Take 10 mg by mouth at bedtime.     Nutritional  Supplements (ARTHRO-COMPLEX PO) Take by mouth daily.     Nutritional Supplements (JOINT FORMULA PO) Take by mouth daily.     omeprazole (PRILOSEC) 40 MG capsule Take 40 mg by mouth daily.     sertraline (ZOLOFT) 50 MG tablet Take 50 mg by mouth daily.     No current facility-administered medications for this visit.   Allergies:  Sulfa antibiotics and Penicillins   ROS: No sudden dizziness or syncope.  Physical Exam: VS:  BP 128/78 (BP Location: Right Arm, Patient Position: Sitting, Cuff Size: Normal)    Pulse 78    Ht 5\' 2"  (1.575 m)    Wt 140 lb 3.2 oz (63.6 kg)    SpO2 98%    BMI 25.64 kg/m , BMI Body mass index is 25.64 kg/m.  Wt Readings from Last 3 Encounters:  04/15/21 140 lb 3.2 oz (63.6 kg)  04/08/20 135 lb (61.2 kg)  01/11/19 146 lb (66.2 kg)    General: Patient appears comfortable at rest. HEENT: Conjunctiva and lids normal, wearing a mask. Neck: Supple, no elevated JVP or carotid bruits, no thyromegaly. Lungs: Clear to auscultation, nonlabored breathing at rest. Cardiac: Regular rate and rhythm, no S3 or significant systolic murmur, no pericardial rub. Extremities: No pitting edema.  ECG:  An ECG dated 04/08/2020 was personally reviewed today and demonstrated:  Sinus with arm lead reversal, incomplete right bundle branch block.  Recent Labwork:  August 2022: Cholesterol 206, triglycerides 121, HDL 77, LDL 105 (previously 193)  Other Studies Reviewed Today:  No interval cardiac testing for review today.  Assessment and Plan:  1.  PSVT, no progressive symptoms on Cardizem CD 240 mg daily.  ECG reviewed.  Continue observation.  2.  Severe mixed hyperlipidemia with multistatin intolerance.  She is doing very well on Repatha.  Requesting most recent lab work from PCP.  Medication Adjustments/Labs and Tests Ordered: Current medicines are reviewed at length with the patient today.  Concerns regarding medicines are outlined above.   Tests Ordered: No orders of the  defined types were placed in this encounter.   Medication Changes: No orders of the defined types were placed in this encounter.   Disposition:  Follow up  1 year.  Signed, Jonelle Sidle, MD, [facility] 04/15/2021 8:59 AM    [facility] [address] [place], [place], [place] [address] Phone: [phone]; Fax: [phone]

## 2021-04-15 ENCOUNTER — Ambulatory Visit: Payer: Medicare PPO | Admitting: Cardiology

## 2021-04-15 ENCOUNTER — Encounter: Payer: Self-pay | Admitting: *Deleted

## 2021-04-15 ENCOUNTER — Encounter: Payer: Self-pay | Admitting: Cardiology

## 2021-04-15 VITALS — BP 128/78 | HR 78 | Ht 62.0 in | Wt 140.2 lb

## 2021-04-15 DIAGNOSIS — I471 Supraventricular tachycardia: Secondary | ICD-10-CM | POA: Diagnosis not present

## 2021-04-15 DIAGNOSIS — E782 Mixed hyperlipidemia: Secondary | ICD-10-CM | POA: Diagnosis not present

## 2021-04-15 NOTE — Patient Instructions (Signed)

## 2021-04-15 NOTE — Addendum Note (Signed)
Addended by: Lesle Chris on: 04/15/2021 09:29 AM   Modules accepted: Orders

## 2021-11-06 DIAGNOSIS — M8580 Other specified disorders of bone density and structure, unspecified site: Secondary | ICD-10-CM | POA: Insufficient documentation

## 2022-06-03 NOTE — Progress Notes (Unsigned)
    Cardiology Office Note  Date: 06/04/2022   ID: Sheena Clay, DOB 1946/01/17, MRN [id number]  History of Present Illness: Sheena Clay is a 77 y.o. female last seen in January 2023.  She is here for a routine visit.  Reports no progressive sense of palpitations, very brief in general, the longest in the interim was about 30 seconds.  She has had no sudden dizziness or syncope.  No exertional chest pain.  I reviewed her lab work per PCP from November 2023.  We went over her medications today, she reports compliance with therapy.  We have not had to modify her Cardizem CD dose.  Physical Exam: VS:  BP 118/64   Pulse 80   Ht 5\' 1"  (1.549 m)   Wt 140 lb 6.4 oz (63.7 kg)   SpO2 98%   BMI 26.53 kg/m , BMI Body mass index is 26.53 kg/m.  Wt Readings from Last 3 Encounters:  06/04/22 140 lb 6.4 oz (63.7 kg)  04/15/21 140 lb 3.2 oz (63.6 kg)  04/08/20 135 lb (61.2 kg)    General: Patient appears comfortable at rest. HEENT: Conjunctiva and lids normal. Neck: Supple, no elevated JVP or carotid bruits. Lungs: Clear to auscultation, nonlabored breathing at rest. Cardiac: Regular rate and rhythm, no S3 or significant systolic murmur.  ECG:  An ECG dated 04/15/2021 was personally reviewed today and demonstrated:  Sinus rhythm with rightward axis.  Labwork:  November 2023: Hemoglobin 14.1, platelets 288, hemoglobin A1c 5.8%, BUN 15, creatinine 0.88, potassium 4.8, AST 21, ALT 37, cholesterol 204, triglycerides 130, HDL 78, LDL 100, TSH 1.58  Other Studies Reviewed Today:  No interval cardiac testing for review today.  Assessment and Plan:  1.  PSVT, continues on Cardizem CD.  She continues to do well without progressive palpitations to necessitate a change in therapy.  ECG shows sinus rhythm with incomplete right bundle branch block and left anterior fascicular block.  Continue with observation.  2.  Mixed hyperlipidemia with multi statin intolerance.  She continues on Repatha,  following lab work with PCP.  Recent HDL 78 and LDL 100.  Disposition:  Follow up  1 year.  Signed, Satira Sark, M.D., F.A.C.C.

## 2022-06-04 ENCOUNTER — Encounter: Payer: Self-pay | Admitting: Cardiology

## 2022-06-04 ENCOUNTER — Ambulatory Visit: Payer: Medicare PPO | Attending: Cardiology | Admitting: Cardiology

## 2022-06-04 VITALS — BP 118/64 | HR 80 | Ht 61.0 in | Wt 140.4 lb

## 2022-06-04 DIAGNOSIS — I4729 Other ventricular tachycardia: Secondary | ICD-10-CM | POA: Diagnosis not present

## 2022-06-04 DIAGNOSIS — I471 Supraventricular tachycardia, unspecified: Secondary | ICD-10-CM | POA: Diagnosis not present

## 2022-06-04 DIAGNOSIS — E782 Mixed hyperlipidemia: Secondary | ICD-10-CM | POA: Diagnosis not present

## 2022-06-04 NOTE — Patient Instructions (Addendum)

## 2022-09-16 DIAGNOSIS — K5904 Chronic idiopathic constipation: Secondary | ICD-10-CM | POA: Insufficient documentation

## 2022-11-11 ENCOUNTER — Ambulatory Visit: Payer: Medicare PPO | Admitting: Allergy & Immunology

## 2022-12-14 DIAGNOSIS — N390 Urinary tract infection, site not specified: Secondary | ICD-10-CM | POA: Insufficient documentation

## 2022-12-14 DIAGNOSIS — M130 Polyarthritis, unspecified: Secondary | ICD-10-CM | POA: Insufficient documentation

## 2022-12-14 DIAGNOSIS — N200 Calculus of kidney: Secondary | ICD-10-CM | POA: Insufficient documentation

## 2022-12-14 DIAGNOSIS — I10 Essential (primary) hypertension: Secondary | ICD-10-CM | POA: Insufficient documentation

## 2022-12-14 DIAGNOSIS — G43909 Migraine, unspecified, not intractable, without status migrainosus: Secondary | ICD-10-CM | POA: Insufficient documentation

## 2022-12-14 NOTE — Progress Notes (Unsigned)
Name: Sheena Clay DOB: October 08, 1945 MRN: [id number]  History of Present Illness: Ms. Sheena Clay is a 77 y.o. female who presents today as a new patient at [facility] [place]. She was referred by [facility] in [place]. All available relevant medical records have been reviewed.  - GU History: 1. Recurrent UTI. 2. Kidney stones.  She was referred by [facility] in [place] for "second opinion due to continued pain".   Urine culture results in past 12 months: - 03/31/2022: Positive for >100k Klebsiella - 06/02/2022: Positive for >100k Klebsiella - 06/23/2022: Positive for 10-50k Enterococcus faecalis - 08/05/2022: Positive for >100k Enterococcus faecalis  Recent history per scanned records: > 06/23/2022: - UA negative. - Urine culture positive for 10-50k Enterococcus faecalis - Ordered CT stone study & started daily Nitrofurantoin 50 mg for UTI prophylaxis.  > 07/22/2022: - CT stone study showed no GU stones, masses, or hydronephrosis.  > 08/05/2022: - Doing well on Nitrofurantoin 50 mg daily re: UTI prevention. - Urine culture positive for >100k Enterococcus faecalis.  ***chronic cystitis / IC? ***true symptomatic UTIs or asymptomatic bacteriuria?  Today: She {Actions; denies-reports:120008} increased urinary urgency, frequency, nocturia, dysuria, gross hematuria, hesitancy, straining to void, or sensations of incomplete emptying.  She {Actions; denies-reports:120008} acute flank pain, abdominal pain, fevers, nausea, or vomiting.   Fall Screening: Do you usually have a device to assist in your mobility? {yes/no:20286} ***cane / ***walker / ***wheelchair  Medications: Current Outpatient Medications  Medication Sig Dispense Refill   aspirin 81 MG tablet Take 81 mg by mouth daily.     Calcium Carbonate-Vitamin D (CALCIUM 600 + D PO) Take by mouth daily.     Cetirizine HCl (ZYRTEC PO) Take 10 mg by mouth daily.      diltiazem (CARDIZEM CD) 240 MG 24 hr  capsule TAKE 1 CAPSULE EVERY DAY 90 capsule 1   DULoxetine (CYMBALTA) 60 MG capsule Take 60 mg by mouth daily.     Evolocumab (REPATHA) 140 MG/ML SOSY Inject 140 mg into the skin every 14 (fourteen) days. 6 mL 3   fenofibrate (TRICOR) 145 MG tablet Take 145 mg by mouth daily.     fluticasone (FLONASE) 50 MCG/ACT nasal spray Place 1 spray into both nostrils daily as needed.      montelukast (SINGULAIR) 10 MG tablet Take 10 mg by mouth at bedtime.     Nutritional Supplements (ARTHRO-COMPLEX PO) Take by mouth daily.     Nutritional Supplements (JOINT FORMULA PO) Take by mouth daily.     omeprazole (PRILOSEC) 40 MG capsule Take 40 mg by mouth daily.     sertraline (ZOLOFT) 50 MG tablet Take 50 mg by mouth daily.     No current facility-administered medications for this visit.    Allergies: Allergies  Allergen Reactions   Sulfa Antibiotics    Penicillins Itching and Rash    Past Medical History:  Diagnosis Date   Asthma    Depression    GERD (gastroesophageal reflux disease)    Mixed hyperlipidemia    Osteoarthritis    PSVT (paroxysmal supraventricular tachycardia)    Documented 2012 with syncope and urgent cardioversion   Past Surgical History:  Procedure Laterality Date   ABDOMINAL HYSTERECTOMY     ANKLE SURGERY Left    ORIF   Family History  Problem Relation Age of Onset   Other Other        Negative for premature coronary artery disease or arrhythmia   Ovarian cancer Mother    Social  History   Socioeconomic History   Marital status: Widowed    Spouse name: Not on file   Number of children: Not on file   Years of education: Not on file   Highest education level: Not on file  Occupational History    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  Tobacco Use   Smoking status: Never   Smokeless tobacco: Never  Substance and Sexual Activity   Alcohol use: No    Alcohol/week: 0.0 standard drinks of alcohol   Drug use: No   Sexual activity: Not on file  Other  Topics Concern   Not on file  Social History Narrative   Not on file   Social Determinants of Health   Financial Resource Strain: Not on file  Food Insecurity: Not on file  Transportation Needs: No Transportation Needs (01/17/2020)   Received from [facility]   [facility] - Transportation    Lack of Transportation (Medical): No    Lack of Transportation (Non-Medical): No  Physical Activity: Not on file  Stress: Not on file  Social Connections: Not on file  Intimate Partner Violence: Not on file    SUBJECTIVE  Review of Systems Constitutional: Patient ***denies any unintentional weight loss or change in strength lntegumentary: Patient ***denies any rashes or pruritus Eyes: Patient denies ***dry eyes ENT: Patient ***denies dry mouth Cardiovascular: Patient ***denies chest pain or syncope Respiratory: Patient ***denies shortness of breath Gastrointestinal: Patient ***denies nausea, vomiting, constipation, or diarrhea Musculoskeletal: Patient ***denies muscle cramps or weakness Neurologic: Patient ***denies convulsions or seizures Psychiatric: Patient ***denies memory problems Allergic/Immunologic: Patient ***denies recent allergic reaction(s) Hematologic/Lymphatic: Patient denies bleeding tendencies Endocrine: Patient ***denies heat/cold intolerance  GU: As per HPI.  OBJECTIVE There were no vitals filed for this visit. There is no height or weight on file to calculate BMI.  Physical Examination  Constitutional: ***No obvious distress; patient is ***non-toxic appearing  Cardiovascular: ***No visible lower extremity edema.  Respiratory: The patient does ***not have audible wheezing/stridor; respirations do ***not appear labored  Gastrointestinal: Abdomen ***non-distended Musculoskeletal: ***Normal ROM of UEs  Skin: ***No obvious rashes/open sores  Neurologic: CN 2-12 grossly ***intact Psychiatric: Answered questions ***appropriately with ***normal affect   Hematologic/Lymphatic/Immunologic: ***No obvious bruises or sites of spontaneous bleeding  UA: ***negative / *** WBC/hpf, *** RBC/hpf, bacteria (***) PVR: *** ml  ASSESSMENT No diagnosis found. ***  Will plan for follow up in *** months or sooner if needed. Pt verbalized understanding and agreement. All questions were answered.  PLAN Advised the following: *** ***No follow-ups on file.  No orders of the defined types were placed in this encounter.   It has been explained that the patient is to follow regularly with their PCP in addition to all other providers involved in their care and to follow instructions provided by these respective offices. Patient advised to contact urology clinic if any urologic-pertaining questions, concerns, new symptoms or problems arise in the interim period.  There are no Patient Instructions on file for this visit.  Electronically signed by:  Donnita Falls, MSN, FNP-C, CUNP 12/14/2022 1:12 PM

## 2022-12-17 ENCOUNTER — Ambulatory Visit: Payer: Medicare PPO | Admitting: Urology

## 2022-12-17 ENCOUNTER — Encounter: Payer: Self-pay | Admitting: Urology

## 2022-12-17 VITALS — BP 144/81 | HR 94 | Temp 98.0°F

## 2022-12-17 DIAGNOSIS — N200 Calculus of kidney: Secondary | ICD-10-CM

## 2022-12-17 DIAGNOSIS — H04123 Dry eye syndrome of bilateral lacrimal glands: Secondary | ICD-10-CM | POA: Insufficient documentation

## 2022-12-17 DIAGNOSIS — R350 Frequency of micturition: Secondary | ICD-10-CM | POA: Diagnosis not present

## 2022-12-17 DIAGNOSIS — N3941 Urge incontinence: Secondary | ICD-10-CM

## 2022-12-17 DIAGNOSIS — R351 Nocturia: Secondary | ICD-10-CM | POA: Diagnosis not present

## 2022-12-17 DIAGNOSIS — N390 Urinary tract infection, site not specified: Secondary | ICD-10-CM

## 2022-12-17 DIAGNOSIS — N3281 Overactive bladder: Secondary | ICD-10-CM

## 2022-12-17 DIAGNOSIS — K5904 Chronic idiopathic constipation: Secondary | ICD-10-CM

## 2022-12-17 DIAGNOSIS — Z87440 Personal history of urinary (tract) infections: Secondary | ICD-10-CM

## 2022-12-17 DIAGNOSIS — R3915 Urgency of urination: Secondary | ICD-10-CM

## 2022-12-17 LAB — URINALYSIS, ROUTINE W REFLEX MICROSCOPIC
Bilirubin, UA: NEGATIVE
Glucose, UA: NEGATIVE
Ketones, UA: NEGATIVE
Nitrite, UA: NEGATIVE
Protein,UA: NEGATIVE
RBC, UA: NEGATIVE
Specific Gravity, UA: <1.005 — ABNORMAL LOW (ref 1.005–1.030)
Urobilinogen, Ur: 0.2 mg/dL (ref 0.2–1.0)
pH, UA: 6.5 (ref 5.0–7.5)

## 2022-12-17 LAB — MICROSCOPIC EXAMINATION
Bacteria, UA: NONE SEEN
RBC, Urine: NONE SEEN /[HPF] (ref 0–2)

## 2022-12-17 MED ORDER — ESTRADIOL 0.1 MG/GM VA CREA
TOPICAL_CREAM | VAGINAL | 3 refills | Status: DC
Start: 2022-12-17 — End: 2023-06-16

## 2022-12-17 MED ORDER — GEMTESA 75 MG PO TABS
1.0000 | ORAL_TABLET | Freq: Every day | ORAL | 11 refills | Status: DC
Start: 2022-12-17 — End: 2023-04-02

## 2022-12-17 NOTE — Patient Instructions (Signed)
Recommendations regarding UTI prevention / management:  When UTI symptoms occur: Call urology office to request order for urine culture. We recommend waiting for urine culture result prior to use of any antibiotics.  For bladder pain/ burning with urination: Over the counter Pyridium (phenazopyridine) as needed (commonly known under the "AZO" brand). No more than 3 days consecutively at a time due to risk for methemoglobinemia, liver function issues, and bone health damage with long term use of Pyridium.  Routine use for UTI prevention: - Topical vaginal estrogen for vaginal atrophy. Adequate fluid intake (>1.5 liters/day) to flush out the urinary tract. - Go to the bathroom to urinate every 4-6 hours while awake to minimize urinary stasis / bacterial overgrowth in the bladder. - Proanthocyanidin (PAC) supplement 36 mg daily; must be soluble (insoluble form of PAC will be ineffective). Recommended brand: Ellura. This is an over-the-counter supplement (often must be found/ purchased online) supplement derived from cranberries with concentrated active component: Proanthocyanidin (PAC) 36 mg daily. Decreases bacterial adherence to bladder lining. Not recommended for patients with interstitial cystitis due to acidity. - D-mannose powder (2 grams daily). This is an over-the-counter supplement which decreases bacterial adherence to bladder lining (it is a sugar that inhibits bacterial adherence to urothelial cells by binding to the pili of enteric bacteria). Take as per manufacturer recommendation. Can be used as an alternative or in addition to the concentrated cranberry supplement. Not recommended for diabetic patients due to sugar content. - Vitamin C supplement to acidify urine to minimize bacterial growth. Not recommended for patients with interstitial cystitis due to acidity. - Probiotic to maintain healthy vaginal microbiome to suppress bacteria at urethral opening. Brand recommendations: Darrold Junker  (includes probiotic & D-mannose ), Feminine Balance (highest concentration of lactobacillus) or Hyperbiotic Pro 15.     Overactive bladder (OAB) overview for patients:  Symptoms may include: urinary urgency ("gotta go" feeling) urinary frequency (voiding >8 times per day) night time urination (nocturia) urge incontinence of urine (UUI)  While we do not know the exact etiology of OAB, several treatment options exist including:  Behavioral therapy: Reducing fluid intake Decreasing bladder stimulants (such as caffeine) and irritants (such as acidic food, spicy foods, alcohol) Urge suppression strategies Bladder retraining via timed voiding  Pelvic floor physical therapy  Medication(s) - can use one or both of the drug classes below. Anticholinergic / antimuscarinic medications:  Mechanism of action: Activate M3 receptors to reduce detrusor stimulation and increase bladder capacity  (parasympathetic nervous system). Effect: Relaxes the bladder to decrease overactivity, increase bladder storage capacity, and increase time between voids. Onset: Slow acting (may take 8-12 weeks to determine efficacy). Medications include: Vesicare (Solifenacin), Ditropan (Oxybutynin), Detrol (Tolterodine), Toviaz (Fesoterodine), Sanctura (Trospium), Urispas (Flavoxate), Enablex (Darifenacin), Bentyl (Dicyclomine), Levsin (Hyoscyamine ). Potential side effects include but are not limited to: Dry eyes, dry mouth, constipation, cognitive impairment, dementia risk with long term use, and urinary retention/ incomplete bladder emptying. Insurance companies generally prefer for patients to try 1-2 anticholinergic / antimuscarinic medications first due to low cost. Some exceptions are made based on patient-specific comorbidities / risk factors. Beta-3 agonist medications: Mechanism of action: Stimulates selective B3 adrenergic receptors to cause smooth muscle bladder relaxation (sympathetic nervous  system). Effect: Relaxes the bladder to decrease overactivity, increase bladder storage capacity, and increase time between voids. Onset: Slow acting (may take 8-12 weeks to determine efficacy). Medications include: Myrbetriq (Mirabegron) and Vibegron Leslye Peer). Potential side effects include but are not limited to: urinary retention / incomplete bladder emptying and elevated  blood pressure (more likely to occur in individuals with pre-existing uncontrolled hypertension). These medications tend to be more expensive than the anticholinergic / antimuscarinic medications.   For patients with refractory OAB (if the above treatment options have been unsuccessful): Posterior tibial nerve stimulation (PTNS). Small acupuncture-type needle inserted near ankle with electric current to stimulate bladder via posterior tibial nerve pathway. Initially requires 12 weekly in-office treatments lasting 30 minutes each; followed by monthly in-office treatments lasting 30 minutes each for 1 year.  Bladder Botox injections. How it is done: Typically done via in-office cystoscopy; sometimes done in the OR depending on the situation. The bladder is numbed with lidocaine instilled via a catheter. Then the urologist injects Botox into the bladder muscle wall in about 20 locations. Causes local paralysis of the bladder muscle at the injection sites to reduce bladder muscle overactivity / spasms. The effect lasts for approximately 6 months and cannot be reversed once performed. Risks may included but are not limited to: infection, incomplete bladder emptying/ urinary retention, short term need for self-catheterization or indwelling catheter, and need for repeat therapy. There is a 5-12% chance of needing to catheterize with Botox - that usually resolves in a few months as the Botox wears off. Typically Botox injections would need to be repeated every 3-12 months since this is not a permanent therapy.  Sacral neuromodulation  trial (Medtronic lnterStim or Axonics implant). Sacral neuromodulation is FDA-approved for uncontrolled urinary urgency, urinary frequency, urinary urge incontinence, non-obstructive urinary retention, or fecal incontinence. It is not FDA-approved as a treatment for pain. The goal of this therapy is at least a 50% improvement in symptoms. It is NOT realistic to expect a 100% cure. This is a a 2-step outpatient procedure. After a successful test period, a permanent wire and generator are placed in the OR. We discussed the risk of infection. We reviewed the fact that about 30% of patients fail the test phase and are not candidates for permanent generator placement. During the 1-2 week trial phase, symptoms are documented by the patient to determine response. If patient gets at least a 50% improvement in symptoms, they may then proceed with Step 2. Step 1: Trial lead placement. Per physician discretion, may done one of two ways: Percutaneous nerve evaluation (PNE) in the [facility] urology office. Performed by urologist under local anesthesia (numbing the area with lidocaine) using a spinal needle for placement of test wire, which usually stays in place for 5-7 days to determine therapy response. Test lead placement in OR under anesthesia. Usually stays in place 2 weeks to determine therapy response. > Step 2: Permanent implantation of sacral neuromodulation device, which is performed in the OR.  Sacral neuromodulation implants: All are conditionally MRI safe. Manufacturer: Medtronic Website: [employer]gl therapy/right-for-you.html Options: lnterStim X: Non-rechargeable. The battery lasts 10 years on average. lnterStim Micro: Rechargeable. The battery lasts 15 years on average and must be charged routinely. Approximately 50% smaller implant than lnterStim X implant.  Manufacturer: Axonics Website:  Findrealrelief.axonics.com Options: Non-rechargeable (Axonics F15): The battery lasts 15 years on average. Rechargeable (Axonics R20): The battery lasts 20 years on average and must be charged in office for about 1 hour every 6-10 months on average. Approximately 50% smaller implant than Axonics non-rechargeable implant.  Note: Generally the rechargeable devices are only advised for very small or thin patients who may not have sufficient adipose tissue to comfortably overlay the implanted device.  Suprapubic catheter (SP tube) placement. Only done in severely refractory OAB when all other options  have failed or are not a viable treatment choice depending on patient factors. Involves placement of a catheter through the lower abdomen into the bladder to continuously drain the bladder into an external collection bag, which patient can then empty at their convenience every few hours. Done via an outpatient surgical procedure in the OR under anesthesia. Risks may included but are not limited to: surgical site pain, infections, skin irritation / breakdown, chronic bacteriuria, symptomatic UTls. The SP tube must stay in place continuously. This is a reversible procedure however - the insertion site will close if catheter is removed for more than a few hours. The SP tube must be exchanged routinely every 4 weeks to prevent the catheter from becoming clogged with sediment. SP tube exchanges are typically performed at a urology nurse visit or by a home health nurse.

## 2023-02-12 ENCOUNTER — Ambulatory Visit: Payer: Medicare PPO | Admitting: Urology

## 2023-02-16 ENCOUNTER — Ambulatory Visit: Payer: Medicare PPO | Admitting: Urology

## 2023-02-23 ENCOUNTER — Ambulatory Visit: Payer: Medicare PPO | Admitting: Urology

## 2023-03-29 NOTE — Progress Notes (Signed)
Name: Sheena Clay DOB: [birth year] MRN: [id number]  History of Present Illness: Sheena Clay is a 78 y.o. female who presents today for follow up visit at [facility] [place]. - GU history: 1. Recurrent UTI. 2. OAB with urinary frequency, nocturia, urgency, and occasional urge incontinence. 3. Kidney stones (distant history).  - 07/22/2022: CT stone study showed no GU stones, masses, or hydronephrosis.   Urine culture results in past 12 months: - 03/31/2022: Positive for >100k Klebsiella - 06/02/2022: Positive for >100k Klebsiella - 06/23/2022: Positive for 10-50k Enterococcus faecalis - 08/05/2022: Positive for >100k Enterococcus faecalis  At initial visit on 12/17/2022: - Advised the following: Start Gemtesa 75 mg daily.  Start topical vaginal estrogen cream 2-3 nights per week. Minimize caffeine intake. Work on timed voiding / bladder retraining. Adequate fluid intake (>1.5 liters/day) to flush out the urinary tract. Consider OTC supplements for UTI prevention. Return in about 8 weeks (around 02/11/2023) for UA, PVR, & f/u with Sheena Georges NP.  Today: She reports that she took Gemtesa (Vibegron) 75 mg daily for at least 8 weeks then discontinued it because she didn't have any noticeable improvement on that in terms of her nocturia. She reports that she gets up 3-4 times per night but states that is not significantly bothersome. Denies bothersome daytime urinary frequency or urgency. Denies urge incontinence; continues to wear a pad daily just in case.   She reports 0 UTIs since last visit. She reports that for the past week or so she's been having some dysuria, intermittent midline lower abdominal pain, and midline lower abdominal pain.  Reports ongoing chronic constipation. Reports that she has already been worked up for that with PCP and GI; even tried Linzess and daily Miralax at one point without significant improvement.  She denies gross hematuria, straining to  void, or sensations of incomplete emptying.  She has been using vaginal estrogen cream at a frequency of 2 time(s) per week. She denies vaginal pain, bleeding, discharge.   Fall Screening: Do you usually have a device to assist in your mobility? No   Medications: Current Outpatient Medications  Medication Sig Dispense Refill   aspirin 81 MG tablet Take 81 mg by mouth daily.     Calcium Carbonate-Vitamin D (CALCIUM 600 + D PO) Take by mouth daily.     Cetirizine HCl (ZYRTEC PO) Take 10 mg by mouth daily.      diltiazem (CARDIZEM CD) 240 MG 24 hr capsule TAKE 1 CAPSULE EVERY DAY 90 capsule 1   DULoxetine (CYMBALTA) 60 MG capsule Take 60 mg by mouth daily.     estradiol (ESTRACE) 0.1 MG/GM vaginal cream Discard plastic applicator. Insert a blueberry size amount (approximately 1 gram) of cream on fingertip inside vagina at bedtime every night for 1 week then 2 nights per week for long term use. 30 g 3   Evolocumab (REPATHA) 140 MG/ML SOSY Inject 140 mg into the skin every 14 (fourteen) days. 6 mL 3   fluticasone (FLONASE) 50 MCG/ACT nasal spray Place 1 spray into both nostrils daily as needed.      montelukast (SINGULAIR) 10 MG tablet Take 10 mg by mouth at bedtime.     Nutritional Supplements (ARTHRO-COMPLEX PO) Take by mouth daily.     Nutritional Supplements (JOINT FORMULA PO) Take by mouth daily.     omeprazole (PRILOSEC) 40 MG capsule Take 40 mg by mouth daily.     sertraline (ZOLOFT) 50 MG tablet Take 50 mg by mouth daily.  fenofibrate (TRICOR) 145 MG tablet Take 145 mg by mouth daily.     No current facility-administered medications for this visit.    Allergies: Allergies  Allergen Reactions   Sulfa Antibiotics    Penicillins Itching and Rash    Past Medical History:  Diagnosis Date   Asthma    Depression    GERD (gastroesophageal reflux disease)    Mixed hyperlipidemia    Osteoarthritis    PSVT (paroxysmal supraventricular tachycardia) (HCC)    Documented 2012 with  syncope and urgent cardioversion   Past Surgical History:  Procedure Laterality Date   ABDOMINAL HYSTERECTOMY     ANKLE SURGERY Left    ORIF   Family History  Problem Relation Age of Onset   Other Other        Negative for premature coronary artery disease or arrhythmia   Ovarian cancer Mother    Social History   Socioeconomic History   Marital status: Widowed    Spouse name: Not on file   Number of children: Not on file   Years of education: Not on file   Highest education level: Not on file  Occupational History    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  Tobacco Use   Smoking status: Never   Smokeless tobacco: Never  Substance and Sexual Activity   Alcohol use: No    Alcohol/week: 0.0 standard drinks of alcohol   Drug use: No   Sexual activity: Not Currently  Other Topics Concern   Not on file  Social History Narrative   Not on file   Social Drivers of Health   Financial Resource Strain: Not on file  Food Insecurity: Not on file  Transportation Needs: No Transportation Needs (01/17/2020)   [facility], [facility]   [facility] - Transportation    Lack of Transportation (Medical): No    Lack of Transportation (Non-Medical): No  Physical Activity: Not on file  Stress: Not on file  Social Connections: Not on file  Intimate Partner Violence: Not on file    Review of Systems Constitutional: Patient denies any unintentional weight loss or change in strength lntegumentary: Patient denies any rashes or pruritus Cardiovascular: Patient denies chest pain or syncope Respiratory: Patient denies shortness of breath Gastrointestinal: As per HPI Musculoskeletal: Patient denies muscle cramps or weakness Neurologic: Patient denies convulsions or seizures Allergic/Immunologic: Patient denies recent allergic reaction(s) Hematologic/Lymphatic: Patient denies bleeding tendencies Endocrine: Patient denies heat/cold intolerance  GU: As per  HPI.  OBJECTIVE Vitals:   04/02/23 1138  BP: (!) 150/81  Pulse: 86  Temp: 98 F (36.7 C)   There is no height or weight on file to calculate BMI.  Physical Examination Constitutional: No obvious distress; patient is non-toxic appearing  Cardiovascular: No visible lower extremity edema.  Respiratory: The patient does not have audible wheezing/stridor; respirations do not appear labored  Gastrointestinal: Abdomen non-distended Musculoskeletal: Normal ROM of UEs  Skin: No obvious rashes/open sores  Neurologic: CN 2-12 grossly intact Psychiatric: Answered questions appropriately with normal affect  Hematologic/Lymphatic/Immunologic: No obvious bruises or sites of spontaneous bleeding  UA: negative  PVR: 0 ml  ASSESSMENT Recurrent UTI - Plan: Urinalysis, Routine w reflex microscopic, BLADDER SCAN AMB NON-IMAGING  OAB (overactive bladder) - Plan: Urinalysis, Routine w reflex microscopic, BLADDER SCAN AMB NON-IMAGING  Kidney stones - Plan: Urinalysis, Routine w reflex microscopic, BLADDER SCAN AMB NON-IMAGING  Urge incontinence - Plan: Urinalysis, Routine w reflex microscopic, BLADDER SCAN AMB NON-IMAGING  Recurrent UTIs have  been well managed since starting topical vaginal estrogen cream use. No evidence of UTI today per UA. Advised to continue topical vaginal estrogen cream use at least 2 nights per week for vaginal atrophy.   No acute findings to suggest stone at this time. We agreed to hold off on surveillance imaging for that.  Gemtesa discontinued by patient. Nocturia not significantly bothersome therefore we agreed to proceed with expectant management for that.  We agreed to plan for follow up in 1 year or sooner if needed. Pt verbalized understanding and agreement. All questions were answered.  PLAN Advised the following: 1. Continue topical vaginal estrogen cream 2-3 nights per week. 2. Return in about 1 year (around 04/01/2024) for UA, PVR, & f/u with Sheena Georges  NP.  Orders Placed This Encounter  Procedures   Urinalysis, Routine w reflex microscopic   BLADDER SCAN AMB NON-IMAGING    It has been explained that the patient is to follow regularly with their PCP in addition to all other providers involved in their care and to follow instructions provided by these respective offices. Patient advised to contact urology clinic if any urologic-pertaining questions, concerns, new symptoms or problems arise in the interim period.  There are no Patient Instructions on file for this visit.  Electronically signed by:  Donnita Falls, FNP   04/02/23    11:57 AM

## 2023-04-02 ENCOUNTER — Ambulatory Visit (INDEPENDENT_AMBULATORY_CARE_PROVIDER_SITE_OTHER): Payer: Medicare PPO | Admitting: Urology

## 2023-04-02 ENCOUNTER — Encounter: Payer: Self-pay | Admitting: Urology

## 2023-04-02 VITALS — BP 150/81 | HR 86 | Temp 98.0°F

## 2023-04-02 DIAGNOSIS — N3281 Overactive bladder: Secondary | ICD-10-CM

## 2023-04-02 DIAGNOSIS — N200 Calculus of kidney: Secondary | ICD-10-CM

## 2023-04-02 DIAGNOSIS — Z87442 Personal history of urinary calculi: Secondary | ICD-10-CM

## 2023-04-02 DIAGNOSIS — Z87440 Personal history of urinary (tract) infections: Secondary | ICD-10-CM

## 2023-04-02 DIAGNOSIS — N390 Urinary tract infection, site not specified: Secondary | ICD-10-CM

## 2023-04-02 DIAGNOSIS — N3941 Urge incontinence: Secondary | ICD-10-CM | POA: Diagnosis not present

## 2023-04-02 LAB — URINALYSIS, ROUTINE W REFLEX MICROSCOPIC
Bilirubin, UA: NEGATIVE
Glucose, UA: NEGATIVE
Ketones, UA: NEGATIVE
Leukocytes,UA: NEGATIVE
Nitrite, UA: NEGATIVE
Protein,UA: NEGATIVE
RBC, UA: NEGATIVE
Specific Gravity, UA: <1.005 — ABNORMAL LOW (ref 1.005–1.030)
Urobilinogen, Ur: 0.2 mg/dL (ref 0.2–1.0)
pH, UA: 6 (ref 5.0–7.5)

## 2023-06-16 ENCOUNTER — Encounter: Payer: Self-pay | Admitting: Cardiology

## 2023-06-16 ENCOUNTER — Ambulatory Visit: Payer: Medicare PPO | Attending: Cardiology | Admitting: Cardiology

## 2023-06-16 VITALS — BP 132/76 | HR 85 | Ht 61.0 in | Wt 135.0 lb

## 2023-06-16 DIAGNOSIS — E782 Mixed hyperlipidemia: Secondary | ICD-10-CM

## 2023-06-16 DIAGNOSIS — Z789 Other specified health status: Secondary | ICD-10-CM

## 2023-06-16 DIAGNOSIS — I471 Supraventricular tachycardia, unspecified: Secondary | ICD-10-CM | POA: Diagnosis not present

## 2023-06-16 NOTE — Progress Notes (Signed)
    Cardiology Office Note  Date: 06/16/2023   ID: TYRA GURAL, DOB [birth year], MRN [id number]  History of Present Illness: Sheena Clay is a 78 y.o. female last seen in March 2024.  She is here for a routine visit.  Reports occasional palpitations but no progressing pattern or increasing duration of symptoms.  No associated chest pain or syncope.  We went over her medications.  She remains on Cardizem CD 240 mg daily as before.  Also on Repatha 140 mg/mL every 14 days.  Her last lipid panel showed LDL down to 67 and HDL 75.  I reviewed her ECG today which shows sinus rhythm with R' in lead V1.  Physical Exam: VS:  BP 132/76   Pulse 85   Ht 5\' 1"  (1.549 m)   Wt 135 lb (61.2 kg)   SpO2 96%   BMI 25.51 kg/m , BMI Body mass index is 25.51 kg/m.  Wt Readings from Last 3 Encounters:  06/16/23 135 lb (61.2 kg)  06/04/22 140 lb 6.4 oz (63.7 kg)  04/15/21 140 lb 3.2 oz (63.6 kg)    General: Patient appears comfortable at rest. HEENT: Conjunctiva and lids normal. Neck: Supple, no elevated JVP or carotid bruits. Lungs: Clear to auscultation, nonlabored breathing at rest. Cardiac: Regular rate and rhythm, no S3 or significant systolic murmur.  ECG:  An ECG dated 06/04/2022 was personally reviewed today and demonstrated:  Sinus rhythm with R' in lead V1.  Labwork:  November 2023: HDL 78, LDL 100, triglycerides 130 November 2024: Cholesterol 182, triglycerides 199, HDL 75, LDL 67, Hgb 14.2, platelets 302  Other Studies Reviewed Today:  No interval cardiac testing for review today.  Assessment and Plan:  1.  PSVT.  Overall good control with no increasing palpitations on Cardizem CD 240 mg daily.  ECG reviewed.  Continue with observation.   2.  Mixed hyperlipidemia with statin intolerance.  She is doing well on Repatha 140 mg/mL every 14 days.  Most recent LDL 67 and HDL 75.  Disposition:  Follow up  1 year.  Signed, Jonelle Sidle, M.D., F.A.C.C. [facility] at  [facility]

## 2023-06-16 NOTE — Patient Instructions (Addendum)

## 2024-04-04 ENCOUNTER — Ambulatory Visit: Payer: Medicare PPO | Admitting: Urology

## 2024-06-19 ENCOUNTER — Ambulatory Visit: Admitting: Cardiology
